# Patient Record
Sex: Female | Born: 1990 | Race: Black or African American | Hispanic: No | Marital: Single | Smoking: Never smoker
Health system: Southern US, Community
[De-identification: ages and names within clinical notes are randomized; demographics above are authoritative.]

## PROBLEM LIST (undated history)

## (undated) ENCOUNTER — Inpatient Hospital Stay (HOSPITAL_COMMUNITY): Payer: Self-pay

## (undated) DIAGNOSIS — R51 Headache: Secondary | ICD-10-CM

## (undated) DIAGNOSIS — Z789 Other specified health status: Secondary | ICD-10-CM

## (undated) DIAGNOSIS — O24419 Gestational diabetes mellitus in pregnancy, unspecified control: Secondary | ICD-10-CM

## (undated) DIAGNOSIS — E7800 Pure hypercholesterolemia, unspecified: Secondary | ICD-10-CM

## (undated) DIAGNOSIS — R519 Headache, unspecified: Secondary | ICD-10-CM

## (undated) DIAGNOSIS — T7840XA Allergy, unspecified, initial encounter: Secondary | ICD-10-CM

## (undated) HISTORY — DX: Allergy, unspecified, initial encounter: T78.40XA

---
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
## 2008-01-05 ENCOUNTER — Emergency Department (HOSPITAL_COMMUNITY): Admission: EM | Admit: 2008-01-05 | Discharge: 2008-01-05 | Payer: Self-pay | Admitting: Emergency Medicine

## 2008-01-15 ENCOUNTER — Ambulatory Visit: Payer: Self-pay | Admitting: Internal Medicine

## 2008-01-28 ENCOUNTER — Inpatient Hospital Stay (HOSPITAL_COMMUNITY): Admission: AD | Admit: 2008-01-28 | Discharge: 2008-01-29 | Payer: Self-pay | Admitting: Obstetrics and Gynecology

## 2008-02-01 ENCOUNTER — Inpatient Hospital Stay (HOSPITAL_COMMUNITY): Admission: AD | Admit: 2008-02-01 | Discharge: 2008-02-02 | Payer: Self-pay | Admitting: Obstetrics & Gynecology

## 2008-02-01 ENCOUNTER — Inpatient Hospital Stay (HOSPITAL_COMMUNITY): Admission: AD | Admit: 2008-02-01 | Discharge: 2008-02-01 | Payer: Self-pay | Admitting: Family Medicine

## 2008-02-04 ENCOUNTER — Inpatient Hospital Stay (HOSPITAL_COMMUNITY): Admission: AD | Admit: 2008-02-04 | Discharge: 2008-02-04 | Payer: Self-pay | Admitting: Obstetrics & Gynecology

## 2008-02-11 ENCOUNTER — Inpatient Hospital Stay (HOSPITAL_COMMUNITY): Admission: AD | Admit: 2008-02-11 | Discharge: 2008-02-11 | Payer: Self-pay | Admitting: Obstetrics and Gynecology

## 2008-03-10 ENCOUNTER — Emergency Department (HOSPITAL_COMMUNITY): Admission: EM | Admit: 2008-03-10 | Discharge: 2008-03-10 | Payer: Self-pay | Admitting: Emergency Medicine

## 2008-03-26 ENCOUNTER — Encounter (INDEPENDENT_AMBULATORY_CARE_PROVIDER_SITE_OTHER): Payer: Self-pay | Admitting: Internal Medicine

## 2008-03-26 ENCOUNTER — Ambulatory Visit: Payer: Self-pay | Admitting: Internal Medicine

## 2008-03-26 LAB — CONVERTED CEMR LAB: Chlamydia, DNA Probe: NEGATIVE

## 2008-05-22 ENCOUNTER — Ambulatory Visit: Payer: Self-pay | Admitting: Internal Medicine

## 2008-06-21 ENCOUNTER — Inpatient Hospital Stay (HOSPITAL_COMMUNITY): Admission: AD | Admit: 2008-06-21 | Discharge: 2008-06-21 | Payer: Self-pay | Admitting: Obstetrics & Gynecology

## 2008-07-31 ENCOUNTER — Inpatient Hospital Stay (HOSPITAL_COMMUNITY): Admission: AD | Admit: 2008-07-31 | Discharge: 2008-07-31 | Payer: Self-pay | Admitting: Obstetrics and Gynecology

## 2008-10-15 ENCOUNTER — Inpatient Hospital Stay (HOSPITAL_COMMUNITY): Admission: AD | Admit: 2008-10-15 | Discharge: 2008-10-15 | Payer: Self-pay | Admitting: Obstetrics and Gynecology

## 2008-11-22 ENCOUNTER — Inpatient Hospital Stay (HOSPITAL_COMMUNITY): Admission: AD | Admit: 2008-11-22 | Discharge: 2008-11-22 | Payer: Self-pay | Admitting: Obstetrics and Gynecology

## 2009-01-16 ENCOUNTER — Inpatient Hospital Stay (HOSPITAL_COMMUNITY): Admission: AD | Admit: 2009-01-16 | Discharge: 2009-01-20 | Payer: Self-pay | Admitting: Obstetrics and Gynecology

## 2009-01-17 ENCOUNTER — Encounter (INDEPENDENT_AMBULATORY_CARE_PROVIDER_SITE_OTHER): Payer: Self-pay | Admitting: Obstetrics and Gynecology

## 2010-06-19 ENCOUNTER — Emergency Department (HOSPITAL_COMMUNITY)
Admission: EM | Admit: 2010-06-19 | Discharge: 2010-06-20 | Disposition: A | Discharge status: Home / Self Care | Payer: Self-pay | Attending: Emergency Medicine | Admitting: Emergency Medicine

## 2010-06-19 DIAGNOSIS — R079 Chest pain, unspecified: Secondary | ICD-10-CM | POA: Insufficient documentation

## 2010-06-19 DIAGNOSIS — R42 Dizziness and giddiness: Secondary | ICD-10-CM | POA: Insufficient documentation

## 2010-06-19 DIAGNOSIS — M549 Dorsalgia, unspecified: Secondary | ICD-10-CM | POA: Insufficient documentation

## 2010-06-20 ENCOUNTER — Emergency Department (HOSPITAL_COMMUNITY): Payer: Self-pay

## 2010-07-09 LAB — CBC
HCT: 34.3 % — ABNORMAL LOW (ref 36.0–46.0)
Hemoglobin: 12.6 g/dL (ref 12.0–15.0)
MCHC: 33.2 g/dL (ref 30.0–36.0)
MCHC: 33.4 g/dL (ref 30.0–36.0)
MCV: 86.6 fL (ref 78.0–100.0)
MCV: 86.6 fL (ref 78.0–100.0)
MCV: 86.1 fL (ref 78.0–100.0)
Platelets: 119 10*3/uL — ABNORMAL LOW (ref 150–400)
RDW: 15.2 % (ref 11.5–15.5)
RDW: 15.3 % (ref 11.5–15.5)
WBC: 17.7 10*3/uL — ABNORMAL HIGH (ref 4.0–10.5)
WBC: 8.9 10*3/uL (ref 4.0–10.5)

## 2010-07-11 LAB — WET PREP, GENITAL

## 2010-07-12 LAB — WET PREP, GENITAL: Clue Cells Wet Prep HPF POC: NONE SEEN

## 2010-07-12 LAB — GC/CHLAMYDIA PROBE AMP, GENITAL: GC Probe Amp, Genital: NEGATIVE

## 2010-07-15 LAB — URINALYSIS, ROUTINE W REFLEX MICROSCOPIC
Bilirubin Urine: NEGATIVE
Glucose, UA: NEGATIVE mg/dL
Ketones, ur: 15 mg/dL — AB
Specific Gravity, Urine: 1.02 (ref 1.005–1.030)
Urobilinogen, UA: 1 mg/dL (ref 0.0–1.0)
pH: 6 (ref 5.0–8.0)

## 2010-07-16 LAB — URINALYSIS, ROUTINE W REFLEX MICROSCOPIC
Bilirubin Urine: NEGATIVE
Glucose, UA: NEGATIVE mg/dL
Hgb urine dipstick: NEGATIVE
Ketones, ur: NEGATIVE mg/dL
Nitrite: NEGATIVE
Protein, ur: NEGATIVE mg/dL
Specific Gravity, Urine: 1.025 (ref 1.005–1.030)
Urobilinogen, UA: 0.2 mg/dL (ref 0.0–1.0)
pH: 6 (ref 5.0–8.0)

## 2010-07-16 LAB — WET PREP, GENITAL
Trich, Wet Prep: NONE SEEN
Yeast Wet Prep HPF POC: NONE SEEN

## 2010-07-16 LAB — GC/CHLAMYDIA PROBE AMP, GENITAL
Chlamydia, DNA Probe: NEGATIVE
GC Probe Amp, Genital: NEGATIVE

## 2010-08-14 IMAGING — US US OB TRANSVAGINAL
1 series · 14 of 25 positions shown · non-contrast
Comparison: 01/28/2008

CLINICAL DATA: Right ectopic pregnancy.  Given methotrexate on
[DATE].  Now with heavy bleeding and pain.

TRANSVAGINAL OB ULTRASOUND
TECHNIQUE: Transvaginal ultrasound was performed for evaluation of
the gestation as well as the maternal uterus and adnexal regions.

[Series 1: us ob transvaginal · 0.14mm/px · 14 of 25 slices shown]
[im 1/25]
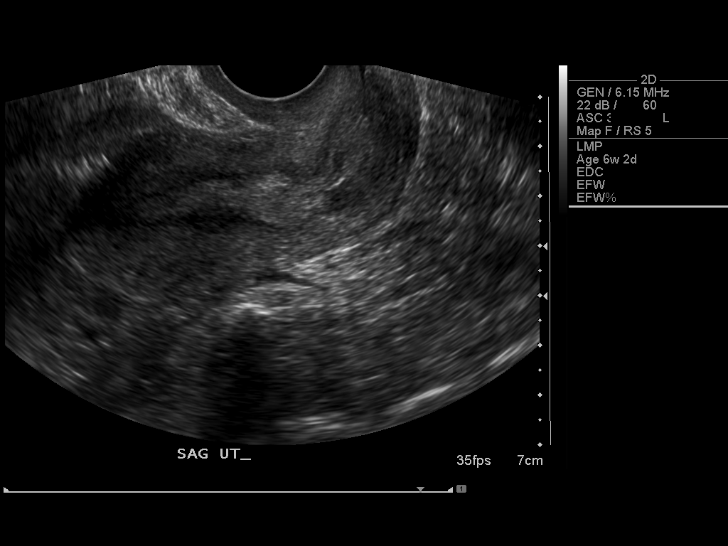
[im 3/25]
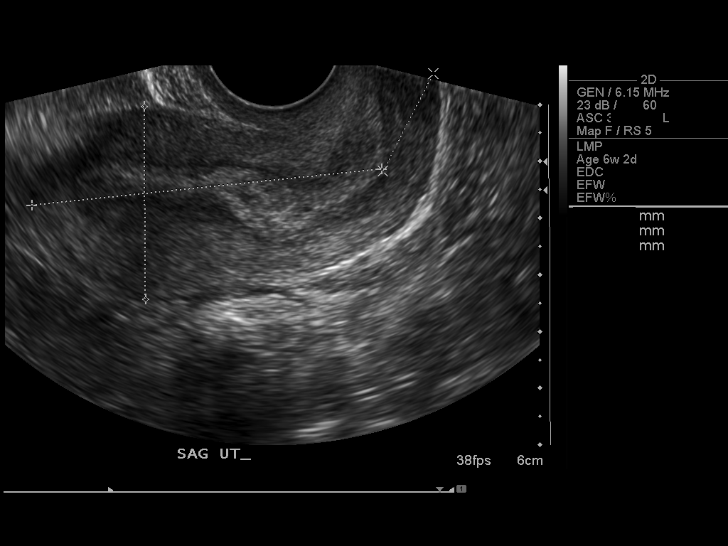
[im 5/25]
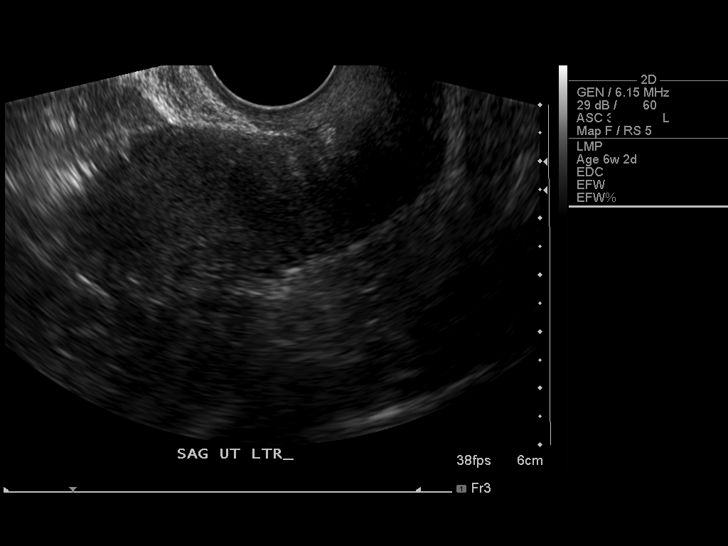
[im 7/25]
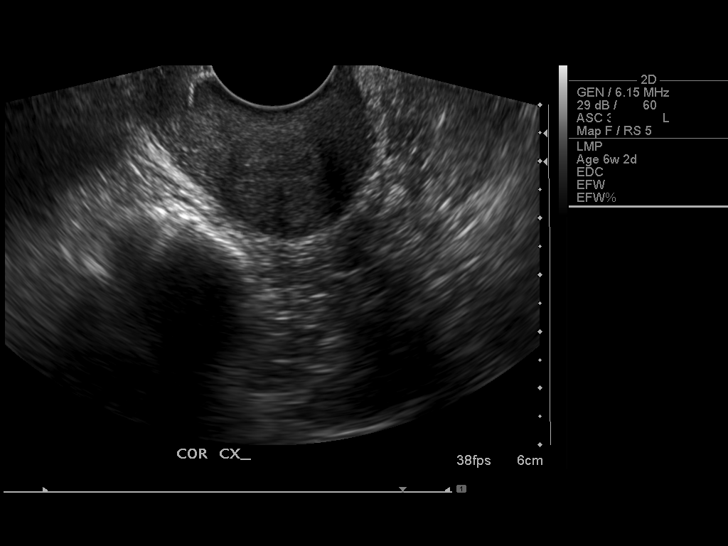
[im 9/25]
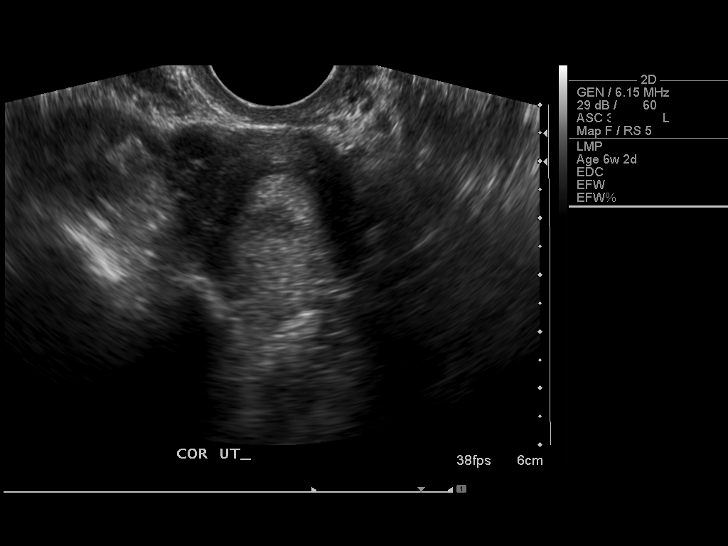
[im 10/25]
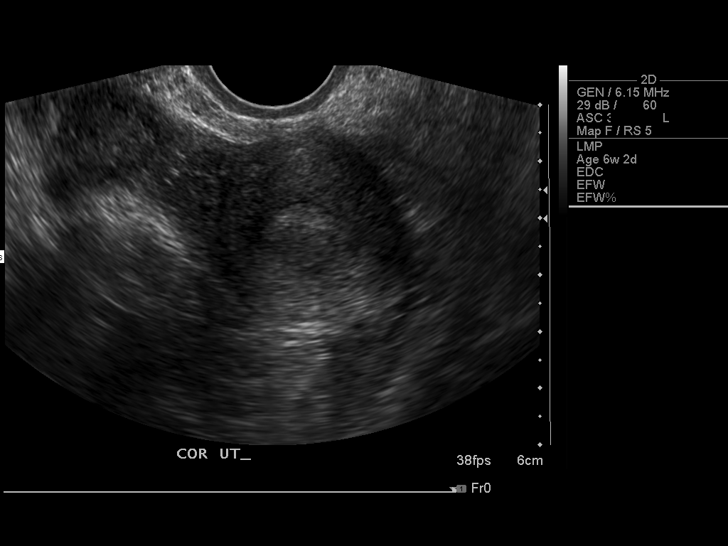
[im 12/25]
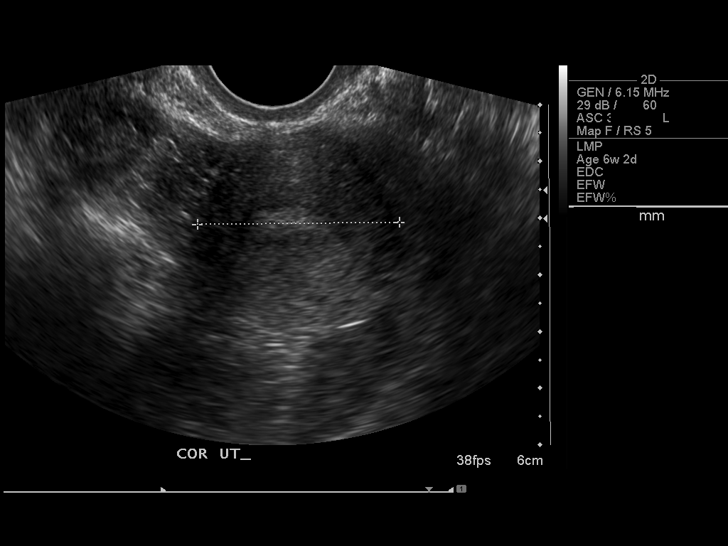
[im 14/25]
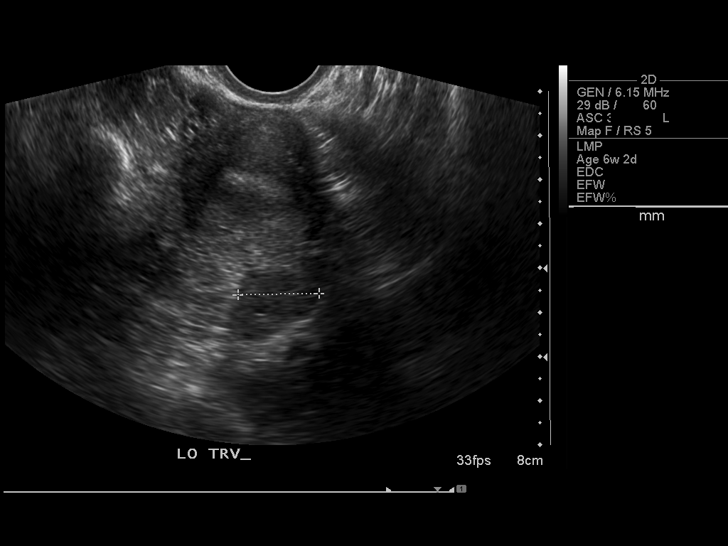
[im 16/25]
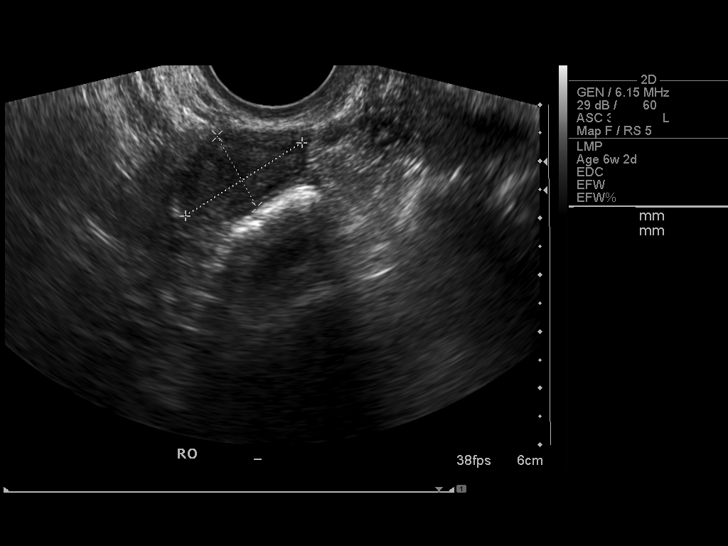
[im 17/25]
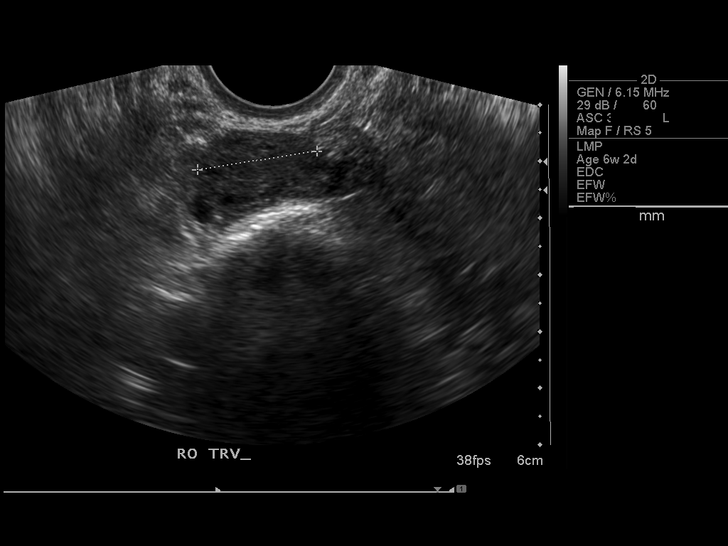
[im 19/25]
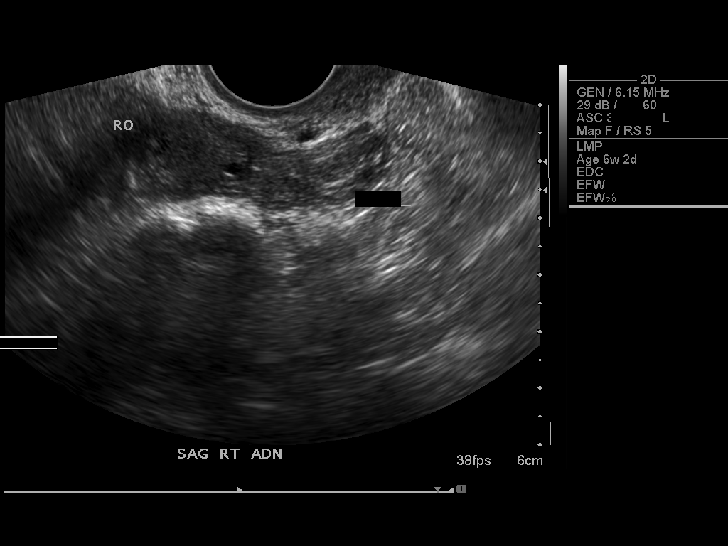
[im 21/25]
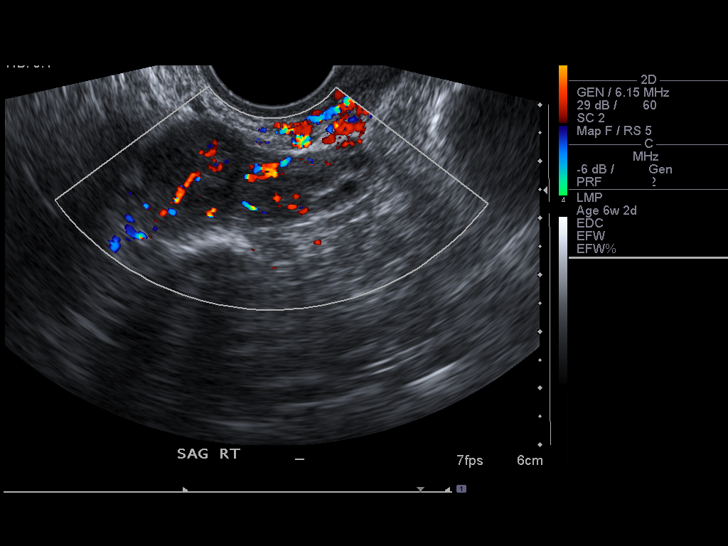
[im 23/25]
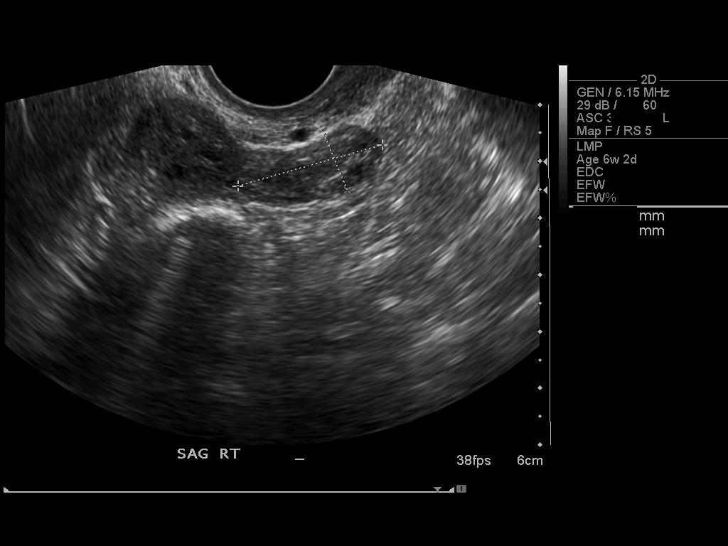
[im 25/25]
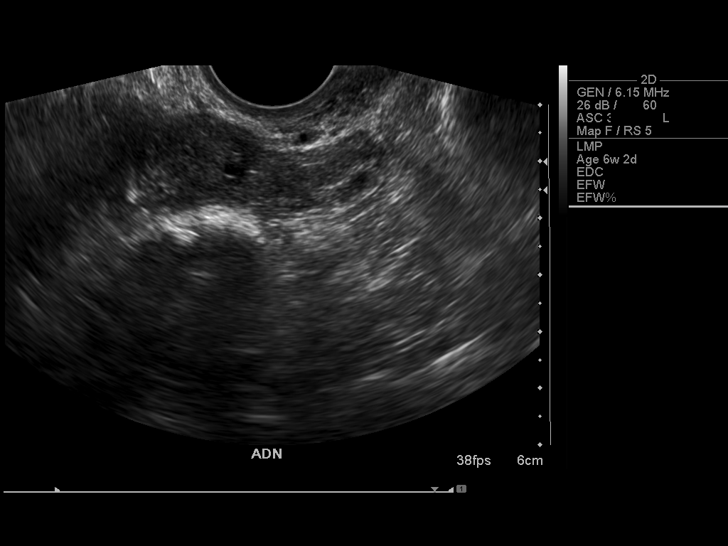

[14 of 25 positions shown; findings below may reference images not displayed]

FINDINGS: No intrauterine gestation is identified.  Within the
right adnexa, there is a soft tissue mass adjacent to the right
ovary, measuring 2.7 x 1.7 x 1.1 cm.  This is slightly smaller when
the mass measured 3.1 cm previously.  No free fluid.
IMPRESSION: 2.7 cm soft tissue mass adjacent to the right ovary in the right
adnexa.  Cannot exclude ectopic pregnancy.

No intrauterine gestation.

## 2010-12-31 LAB — URINE MICROSCOPIC-ADD ON

## 2010-12-31 LAB — GC/CHLAMYDIA PROBE AMP, GENITAL
Chlamydia, DNA Probe: NEGATIVE
GC Probe Amp, Genital: NEGATIVE

## 2010-12-31 LAB — URINALYSIS, ROUTINE W REFLEX MICROSCOPIC
Bilirubin Urine: NEGATIVE
Bilirubin Urine: NEGATIVE
Glucose, UA: NEGATIVE
Protein, ur: NEGATIVE
Urobilinogen, UA: 0.2
pH: 5

## 2010-12-31 LAB — CBC
HCT: 37.8
Platelets: 312
RBC: 4.79
WBC: 8.4

## 2010-12-31 LAB — WET PREP, GENITAL
Clue Cells Wet Prep HPF POC: NONE SEEN
Trich, Wet Prep: NONE SEEN
Yeast Wet Prep HPF POC: NONE SEEN

## 2010-12-31 LAB — HCG, QUANTITATIVE, PREGNANCY: hCG, Beta Chain, Quant, S: <2

## 2011-01-04 LAB — GC/CHLAMYDIA PROBE AMP, GENITAL
Chlamydia, DNA Probe: NEGATIVE
GC Probe Amp, Genital: NEGATIVE

## 2011-01-04 LAB — URINALYSIS, ROUTINE W REFLEX MICROSCOPIC
Nitrite: NEGATIVE
Specific Gravity, Urine: >1.03 — ABNORMAL HIGH
Urobilinogen, UA: 0.2
pH: 6

## 2011-01-04 LAB — URINE MICROSCOPIC-ADD ON

## 2011-01-04 LAB — DIFFERENTIAL
Lymphocytes Relative: 28
Lymphs Abs: 1.8
Monocytes Relative: 7
Neutro Abs: 4.1
Neutrophils Relative %: 62

## 2011-01-04 LAB — CBC
RBC: 4.64
WBC: 6.5

## 2011-01-04 LAB — WET PREP, GENITAL: Trich, Wet Prep: NONE SEEN

## 2011-01-04 LAB — BUN: BUN: 8

## 2011-01-04 LAB — CREATININE, SERUM: GFR calc Af Amer: >60

## 2011-01-04 LAB — AST: AST: 15

## 2011-01-05 LAB — HCG, QUANTITATIVE, PREGNANCY: hCG, Beta Chain, Quant, S: <2

## 2011-01-08 LAB — POCT I-STAT, CHEM 8
Chloride: 104 mEq/L (ref 96–112)
Creatinine, Ser: 1 mg/dL (ref 0.4–1.2)
HCT: 41 % (ref 36.0–46.0)
Hemoglobin: 13.9 g/dL (ref 12.0–15.0)
Potassium: 3.9 mEq/L (ref 3.5–5.1)
Sodium: 140 mEq/L (ref 135–145)

## 2011-01-08 LAB — URINALYSIS, ROUTINE W REFLEX MICROSCOPIC
Bilirubin Urine: NEGATIVE
Ketones, ur: NEGATIVE mg/dL
Leukocytes, UA: NEGATIVE
Nitrite: NEGATIVE
Protein, ur: NEGATIVE mg/dL
Urobilinogen, UA: 0.2 mg/dL (ref 0.0–1.0)

## 2011-01-08 LAB — DIFFERENTIAL
Eosinophils Absolute: 0.2 10*3/uL (ref 0.0–0.7)
Lymphs Abs: 1.6 10*3/uL (ref 0.7–4.0)
Neutro Abs: 3.9 10*3/uL (ref 1.7–7.7)
Neutrophils Relative %: 64 % (ref 43–77)

## 2011-01-08 LAB — CBC
MCV: 82.4 fL (ref 78.0–100.0)
Platelets: 276 10*3/uL (ref 150–400)
RBC: 4.87 MIL/uL (ref 3.87–5.11)
WBC: 6.2 10*3/uL (ref 4.0–10.5)

## 2011-01-08 LAB — WET PREP, GENITAL
Clue Cells Wet Prep HPF POC: NONE SEEN
Trich, Wet Prep: NONE SEEN
Yeast Wet Prep HPF POC: NONE SEEN

## 2011-01-08 LAB — PREGNANCY, URINE: Preg Test, Ur: NEGATIVE

## 2011-01-08 LAB — URINE MICROSCOPIC-ADD ON

## 2011-01-17 ENCOUNTER — Emergency Department (HOSPITAL_COMMUNITY)
Admission: EM | Admit: 2011-01-17 | Discharge: 2011-01-18 | Disposition: A | Discharge status: Home / Self Care | Payer: Self-pay | Attending: Emergency Medicine | Admitting: Emergency Medicine

## 2011-01-17 DIAGNOSIS — Y92009 Unspecified place in unspecified non-institutional (private) residence as the place of occurrence of the external cause: Secondary | ICD-10-CM | POA: Insufficient documentation

## 2011-01-17 DIAGNOSIS — S6000XA Contusion of unspecified finger without damage to nail, initial encounter: Secondary | ICD-10-CM | POA: Insufficient documentation

## 2011-01-17 DIAGNOSIS — D573 Sickle-cell trait: Secondary | ICD-10-CM | POA: Insufficient documentation

## 2011-01-17 DIAGNOSIS — X58XXXA Exposure to other specified factors, initial encounter: Secondary | ICD-10-CM | POA: Insufficient documentation

## 2011-06-09 ENCOUNTER — Encounter (HOSPITAL_COMMUNITY): Payer: Self-pay | Admitting: *Deleted

## 2011-06-09 ENCOUNTER — Inpatient Hospital Stay (HOSPITAL_COMMUNITY)
Admission: AD | Admit: 2011-06-09 | Discharge: 2011-06-09 | Disposition: A | Discharge status: Home / Self Care | Payer: 59 | Source: Ambulatory Visit | Attending: Obstetrics and Gynecology | Admitting: Obstetrics and Gynecology

## 2011-06-09 DIAGNOSIS — B009 Herpesviral infection, unspecified: Secondary | ICD-10-CM

## 2011-06-09 DIAGNOSIS — A6000 Herpesviral infection of urogenital system, unspecified: Secondary | ICD-10-CM | POA: Insufficient documentation

## 2011-06-09 DIAGNOSIS — R300 Dysuria: Secondary | ICD-10-CM | POA: Insufficient documentation

## 2011-06-09 DIAGNOSIS — N390 Urinary tract infection, site not specified: Secondary | ICD-10-CM

## 2011-06-09 HISTORY — DX: Pure hypercholesterolemia, unspecified: E78.00

## 2011-06-09 LAB — URINALYSIS, ROUTINE W REFLEX MICROSCOPIC
Glucose, UA: 100 mg/dL — AB
Protein, ur: 30 mg/dL — AB
Specific Gravity, Urine: 1.02 (ref 1.005–1.030)
Urobilinogen, UA: 1 mg/dL (ref 0.0–1.0)

## 2011-06-09 LAB — URINE MICROSCOPIC-ADD ON

## 2011-06-09 MED ORDER — SULFAMETHOXAZOLE-TRIMETHOPRIM 800-160 MG PO TABS: 1.0000 | ORAL_TABLET | Freq: Two times a day (BID) | ORAL | Status: AC

## 2011-06-09 MED ORDER — VALACYCLOVIR HCL 1 G PO TABS: 1000.0000 mg | ORAL_TABLET | Freq: Two times a day (BID) | ORAL | Status: AC

## 2011-06-09 MED ORDER — LIDOCAINE HCL 2 % EX GEL
Freq: Once | CUTANEOUS | Status: AC
  Administered 2011-06-09: 10 via TOPICAL
  Filled 2011-06-09: qty 20

## 2011-06-09 MED ORDER — LIDOCAINE HCL 2 % EX GEL: Freq: Once | CUTANEOUS | Status: AC

## 2011-06-09 NOTE — Progress Notes (Signed)
As at office on Mon, dx with urinary tract infection, bladder infection and vaginitis.  Was given meds and told to take AZO.  Symptoms getting worse, hurts too much to sit down. Everything down there hurts.  Noted blood in urine this morning. Was tested - cultures were neg.  Frequency and pain with urination.

## 2011-06-09 NOTE — Discharge Instructions (Signed)
Get your prescriptions filled today and take all as directed.  Prescriptions were sent electronically to your pharmacy. Follow up in the office for any additional problems.

## 2011-06-09 NOTE — ED Provider Notes (Signed)
History     Chief Complaint  Patient presents with  . Dysuria   HPI Cathy Woodard 20 y.o. was seen in the office on Monday.  Diagnosed with UTI, vaginitis and given Tindamax, Nystatin cream, and azo tablets.  Today pain is worse.  Having dysuria and cannot sit down.  Does not want genital exam due to pain.   OB History    Grav Para Term Preterm Abortions TAB SAB Ect Mult Living   3 1 1  0 2 0 1 1 0 1      Past Medical History  Diagnosis Date  . Hypercholesteremia     Past Surgical History  Procedure Date  . Cesarean section     Family History  Problem Relation Age of Onset  . Diabetes Maternal Grandmother     History  Substance Use Topics  . Smoking status: Never Smoker   . Smokeless tobacco: Never Used  . Alcohol Use: Yes     Occas    Allergies: No Known Allergies  Prescriptions prior to admission  Medication Sig Dispense Refill  . AZO-CRANBERRY PO Take 1 tablet by mouth 2 (two) times daily.      Marland Kitchen nystatin-triamcinolone ointment (MYCOLOG) Apply 1 application topically 3 (three) times daily as needed. For irritation      . Phenazopyridine HCl (AZO TABS PO) Take 2 tablets by mouth 3 (three) times daily.      Marland Kitchen tinidazole (TINDAMAX) 500 MG tablet Take 1,000 mg by mouth 2 (two) times daily. For 2 days        Review of Systems  Genitourinary: Positive for dysuria.       Blood in urine Vulvar pain   Physical Exam   Blood pressure 118/87, pulse 94, temperature 98.6 F (37 C), temperature source Oral, resp. rate 18, last menstrual period 05/24/2011.  Physical Exam  Nursing note and vitals reviewed. Constitutional: She is oriented to person, place, and time. She appears well-developed and well-nourished.  HENT:  Head: Normocephalic.  Eyes: EOM are normal.  Neck: Neck supple.  GI: Soft. There is no tenderness.  Genitourinary:       Visualization of vulva.  One blister like area on left labia majora, one ulceration on right labia majora, tissue around  clitoris is swollen with red areas noted.  Client not able to tolerate any further exam. Tearful. HSV culture done of ulcerated area.   Musculoskeletal: Normal range of motion.  Neurological: She is alert and oriented to person, place, and time.  Skin: Skin is warm and dry.  Psychiatric: She has a normal mood and affect.    MAU Course  Procedures Results for orders placed during the hospital encounter of 06/09/11 (from the past 24 hour(s))  POCT PREGNANCY, URINE     Status: Normal   Collection Time   06/09/11  9:26 AM      Component Value Range   Preg Test, Ur NEGATIVE  NEGATIVE   URINALYSIS, ROUTINE W REFLEX MICROSCOPIC     Status: Abnormal   Collection Time   06/09/11  9:37 AM      Component Value Range   Color, Urine ORANGE (*) YELLOW    APPearance CLEAR  CLEAR    Specific Gravity, Urine 1.020  1.005 - 1.030    pH 5.5  5.0 - 8.0    Glucose, UA 100 (*) NEGATIVE (mg/dL)   Hgb urine dipstick TRACE (*) NEGATIVE    Bilirubin Urine NEGATIVE  NEGATIVE    Ketones, ur NEGATIVE  NEGATIVE (mg/dL)   Protein, ur 30 (*) NEGATIVE (mg/dL)   Urobilinogen, UA 1.0  0.0 - 1.0 (mg/dL)   Nitrite POSITIVE (*) NEGATIVE    Leukocytes, UA MODERATE (*) NEGATIVE   URINE MICROSCOPIC-ADD ON     Status: Normal   Collection Time   06/09/11  9:37 AM      Component Value Range   Squamous Epithelial / LPF RARE  RARE    WBC, UA 11-20  <3 (WBC/hpf)   Bacteria, UA RARE  RARE    MDM Consult with Dr. 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  Client having emotional distress over diagnosis.  Crying loudly for over 30 minutes.    Assessment and Plan  HSV UTI  Plan: Rx valtrex 1 gm po bid x 5 days Rx Septra DS one po bid x 3 days Lidocaine jelly here in MAU and prescribed. Prescriptions sent to pharmacy electronically HSV culture pending. Client calmer now and will discharge. No sex until healed. Follow up in the office for any further pain.  Sricharan Lacomb 06/09/2011, 10:11 AM   Nolene Bernheim, NP 06/09/11 1101  Nolene Bernheim, NP 06/09/11 1109

## 2011-06-11 ENCOUNTER — Inpatient Hospital Stay (HOSPITAL_COMMUNITY)
Admission: AD | Admit: 2011-06-11 | Discharge: 2011-06-11 | Disposition: A | Discharge status: Home / Self Care | Payer: 59 | Attending: Obstetrics and Gynecology | Admitting: Obstetrics and Gynecology

## 2011-06-11 ENCOUNTER — Encounter (HOSPITAL_COMMUNITY): Payer: Self-pay | Admitting: *Deleted

## 2011-06-11 DIAGNOSIS — M533 Sacrococcygeal disorders, not elsewhere classified: Secondary | ICD-10-CM | POA: Insufficient documentation

## 2011-06-11 DIAGNOSIS — S300XXA Contusion of lower back and pelvis, initial encounter: Secondary | ICD-10-CM

## 2011-06-11 NOTE — Discharge Instructions (Signed)
Tailbone Injury The tailbone (coccyx) is the small bone at the lower end of the spine. A tailbone injury may involve stretched ligaments, bruising, or a broken bone (fracture). Women are more vulnerable to this injury due to having a wider pelvis. CAUSES  This type of injury typically occurs from falling and landing on the tailbone. Repeated strain or friction from actions such as rowing and bicycling may also injure the area. The tailbone can be injured during childbirth. Infections or tumors may also press on the tailbone and cause pain. Sometimes, the cause of injury is unknown. SYMPTOMS   Bruising.   Pain when sitting.   Painful bowel movements.   In women, pain during intercourse.  DIAGNOSIS  Your caregiver can diagnose a tailbone injury based on your symptoms and a physical exam. X-rays may be taken if a fracture is suspected. Your caregiver may also use an MRI scan imaging test to evaluate your symptoms. TREATMENT  Your caregiver may prescribe medicines to help relieve your pain. Most tailbone injuries heal on their own in 4 to 6 weeks. However, if the injury is caused by an infection or tumor, the recovery period may vary. PREVENTION  Wear appropriate padding and sports gear when bicycling and rowing. This can help prevent an injury from repeated strain or friction. HOME CARE INSTRUCTIONS   Put ice on the injured area.   Put ice in a plastic bag.   Place a towel between your skin and the bag.   Leave the ice on for 15 to 20 minutes, every hour while awake for the first 1 to 2 days.   Sit on a large, rubber or inflated ring or cushion to ease your pain. Lean forward when sitting to help decrease discomfort.   Avoid sitting for long periods of time.   Increase your activity as the pain allows.   Only take over-the-counter or prescription medicines for pain, discomfort, or fever as directed by your caregiver.   You may use stool softeners if it is painful to have a bowel  movement, or as directed by your caregiver.   Eat a diet with plenty of fiber to help prevent constipation.   Keep all follow-up appointments as directed by your caregiver.  SEEK MEDICAL CARE IF:   Your pain becomes worse.   Your bowel movements cause a great deal of discomfort.   You are unable to have a bowel movement.   You have a fever.  MAKE SURE YOU:  Understand these instructions.   Will watch your condition.   Will get help right away if you are not doing well or get worse.  Document Released: 03/19/2000 Document Revised: 03/11/2011 Document Reviewed: 10/15/2010 [employer] Patient Information 2012 [employer]. 

## 2011-06-11 NOTE — Progress Notes (Signed)
PT SAYS SHE WENT TO DR ON Monday- FOR VAG IRRITATION- GAVE MED  - TOOK- MED - GOT WORSE SO  CAME  TO  MAU ON WED- TOLD  HAD UTI-   TOOK CX- UNSURE IF HSV - WAITING FOR RESULTS.  SAYS TAILBONE STARTED HURTING  EARLIER- SHE THOUGHT WOULD GO AWAY- BUT KNOW WORSE.  SHE FELT PAIN IN LOWER   GROIN AREA

## 2011-06-11 NOTE — Progress Notes (Signed)
Pt reports pain in tailbone for the last couple of hours. Pt reports started on R side of lower back, but pt now states it is toward the middle.

## 2011-06-11 NOTE — ED Provider Notes (Signed)
History   Pt presents today c/o "tail bone" pain. She states she sits all day at work and someone told her the pain could be coming from the UTI she was recently diagnosed with. She denies worsening urinary sx, vag sx, fever, or any other sx. She is taking all medications as directed.  Chief Complaint  Patient presents with  . Tailbone Pain   HPI  OB History    Grav Para Term Preterm Abortions TAB SAB Ect Mult Living   3 1 1  0 2 0 1 1 0 1      Past Medical History  Diagnosis Date  . Hypercholesteremia     Past Surgical History  Procedure Date  . Cesarean section     Family History  Problem Relation Age of Onset  . Diabetes Maternal Grandmother   . Anesthesia problems Neg Hx   . Hypotension Neg Hx   . Malignant hyperthermia Neg Hx   . Pseudochol deficiency Neg Hx     History  Substance Use Topics  . Smoking status: Never Smoker   . Smokeless tobacco: Never Used  . Alcohol Use: Yes     Occas    Allergies: No Known Allergies  Prescriptions prior to admission  Medication Sig Dispense Refill  . lidocaine (XYLOCAINE) 2 % jelly Apply topically once.  30 mL  1  . sulfamethoxazole-trimethoprim (BACTRIM DS) 800-160 MG per tablet Take 1 tablet by mouth 2 (two) times daily.  6 tablet  0  . tinidazole (TINDAMAX) 500 MG tablet Take 1,000 mg by mouth 2 (two) times daily. For 2 days      . valACYclovir (VALTREX) 1000 MG tablet Take 1 tablet (1,000 mg total) by mouth 2 (two) times daily.  10 tablet  0    Review of Systems  Constitutional: Negative for fever and chills.  Eyes: Negative for blurred vision and double vision.  Respiratory: Negative for cough, hemoptysis, sputum production, shortness of breath and wheezing.   Cardiovascular: Negative for chest pain and palpitations.  Gastrointestinal: Negative for nausea, vomiting, abdominal pain, diarrhea and constipation.  Genitourinary: Negative for dysuria, urgency, frequency and hematuria.  Musculoskeletal: Negative for  falls.  Neurological: Negative for dizziness and headaches.  Psychiatric/Behavioral: Negative for depression and suicidal ideas.   Physical Exam   Blood pressure 102/68, pulse 90, temperature 98.5 F (36.9 C), temperature source Oral, resp. rate 18, height 5\' 5"  (1.651 m), weight 187 lb 6 oz (84.993 kg), last menstrual period 05/24/2011.  Physical Exam  Nursing note and vitals reviewed. Constitutional: She is oriented to person, place, and time. She appears well-developed and well-nourished. No distress.  HENT:  Head: Normocephalic and atraumatic.  Eyes: EOM are normal. Pupils are equal, round, and reactive to light.  GI: Soft. She exhibits no distension. There is no tenderness. There is no rebound and no guarding.  Musculoskeletal:       Pt with tenderness over coccyx to palpation. No deformity noted. No skin lesions noted. No evidence of pilonidal cyst.  Neurological: She is alert and oriented to person, place, and time.  Skin: Skin is warm and dry. She is not diaphoretic.  Psychiatric: She has a normal mood and affect. Her behavior is normal. Judgment and thought content normal.    MAU Course  Procedures    Assessment and Plan  Bruised coccyx: discussed with pt at length. She will f/u with her PCP. Advised to avoid unnecessary pressure. Pt was given a donut cushion. Discussed with pt at length. Discussed  diet, activity, risks, and precautions.  Clinton Gallant. Thorin Starner III, DrHSc, MPAS, PA-C  06/11/2011, 2:00 AM   Henrietta Hoover, PA 06/11/11 [phone]

## 2011-11-27 ENCOUNTER — Inpatient Hospital Stay (HOSPITAL_COMMUNITY)
Admission: AD | Admit: 2011-11-27 | Discharge: 2011-11-28 | Disposition: A | Discharge status: Home / Self Care | Payer: 59 | Source: Ambulatory Visit | Attending: Family Medicine | Admitting: Family Medicine

## 2011-11-27 DIAGNOSIS — L259 Unspecified contact dermatitis, unspecified cause: Secondary | ICD-10-CM | POA: Insufficient documentation

## 2011-11-27 DIAGNOSIS — L239 Allergic contact dermatitis, unspecified cause: Secondary | ICD-10-CM

## 2011-11-27 HISTORY — DX: Other specified health status: Z78.9

## 2011-11-27 LAB — POCT PREGNANCY, URINE: Preg Test, Ur: NEGATIVE

## 2011-11-27 NOTE — MAU Note (Signed)
Pt states, " I noticed having itching and red bumps on my right breast at 7 pm tonight." Pt reports changing to cetaphil soap, no detergent change.

## 2011-11-28 ENCOUNTER — Encounter (HOSPITAL_COMMUNITY): Payer: Self-pay | Admitting: *Deleted

## 2011-11-28 DIAGNOSIS — L259 Unspecified contact dermatitis, unspecified cause: Secondary | ICD-10-CM

## 2011-11-28 MED ORDER — TRIAMCINOLONE ACETONIDE 0.1 % EX CREA
1.0000 "application " | TOPICAL_CREAM | Freq: Two times a day (BID) | CUTANEOUS | Status: DC
  Administered 2011-11-28: 1 via TOPICAL
  Filled 2011-11-28: qty 15

## 2011-11-28 MED ORDER — DIPHENHYDRAMINE HCL 25 MG PO CAPS
25.0000 mg | ORAL_CAPSULE | Freq: Four times a day (QID) | ORAL | Status: DC | PRN
  Administered 2011-11-28: 25 mg via ORAL
  Filled 2011-11-28: qty 1

## 2011-11-28 MED ORDER — TRIAMCINOLONE ACETONIDE 0.1 % EX CREA: 1.0000 "application " | TOPICAL_CREAM | Freq: Two times a day (BID) | CUTANEOUS | Status: DC

## 2011-11-28 NOTE — MAU Provider Note (Signed)
  History     CSN: [id number]  Arrival date and time: 11/27/11 2304   None     Chief Complaint  Patient presents with  . Rash   HPI Cathy Woodard is a 20yo who presents for eval of sudden onset rash on right breast this evening. No difficulty with breathing, no throat swelling, hives, or any other symptoms. She reports starting to use Cetaphil soap over her entire body yesterday (hx exzema) but no new detergents or creams.  OB History    Grav Para Term Preterm Abortions TAB SAB Ect Mult Living   3 1 1  0 2 0 1 1 0 1      Past Medical History  Diagnosis Date  . Hypercholesteremia   . No pertinent past medical history     Past Surgical History  Procedure Date  . Cesarean section     Family History  Problem Relation Age of Onset  . Diabetes Maternal Grandmother   . Anesthesia problems Neg Hx   . Hypotension Neg Hx   . Malignant hyperthermia Neg Hx   . Pseudochol deficiency Neg Hx     History  Substance Use Topics  . Smoking status: Never Smoker   . Smokeless tobacco: Never Used  . Alcohol Use: Yes     Occas    Allergies: No Known Allergies  Prescriptions prior to admission  Medication Sig Dispense Refill  . lidocaine (XYLOCAINE) 2 % jelly Apply topically once.  30 mL  1  . valACYclovir (VALTREX) 1000 MG tablet Take 1 tablet (1,000 mg total) by mouth 2 (two) times daily.  10 tablet  0  . DISCONTD: AZO-CRANBERRY PO Take 1 tablet by mouth 3 (three) times daily. For urinary pain      . DISCONTD: nystatin-triamcinolone ointment (MYCOLOG) Apply 1 application topically 2 (two) times daily.        ROS Physical Exam   Blood pressure 113/74, pulse 74, temperature 98.6 F (37 C), temperature source Oral, resp. rate 16, height 5\' 4"  (1.626 m), weight 77.678 kg (171 lb 4 oz), last menstrual period 11/16/2011.  Physical Exam  Constitutional: She is oriented to person, place, and time. She appears well-developed and well-nourished.  HENT:  Head: Normocephalic.    Cardiovascular: Normal rate.   Respiratory: Effort normal.       Right breast with multiple raised erythematous patches over anterior aspect; no exudate; seems to follow along bra/shirt line but does not continue along shoulder and is not present on left breast  Musculoskeletal: Normal range of motion.  Neurological: She is alert and oriented to person, place, and time.  Skin: Skin is warm and dry.  Psychiatric: She has a normal mood and affect. Her behavior is normal. Thought content normal.   UPT negative  MAU Course  Procedures    Assessment and Plan  Allergic contact dermatitis  Given Benadryl 25mg  PO and tube & rx Kenalog 0.1% cream to apply BID x 2 weeks F/U with primary doctor if symptoms don't improve  SHAW, KIMBERLY 11/28/2011, 12:45 AM

## 2011-11-28 NOTE — MAU Provider Note (Signed)
Chart reviewed and agree with management and plan.  

## 2012-07-19 ENCOUNTER — Ambulatory Visit (INDEPENDENT_AMBULATORY_CARE_PROVIDER_SITE_OTHER): Payer: 59 | Admitting: Physician Assistant

## 2012-07-19 VITALS — BP 111/72 | HR 75 | Temp 98.8°F | Resp 16 | Ht 64.5 in | Wt 174.0 lb

## 2012-07-19 DIAGNOSIS — J309 Allergic rhinitis, unspecified: Secondary | ICD-10-CM

## 2012-07-19 DIAGNOSIS — R51 Headache: Secondary | ICD-10-CM

## 2012-07-19 MED ORDER — IBUPROFEN 600 MG PO TABS: 600.0000 mg | ORAL_TABLET | Freq: Three times a day (TID) | ORAL | Status: DC | PRN

## 2012-07-19 MED ORDER — FLUTICASONE PROPIONATE 50 MCG/ACT NA SUSP: 2.0000 | Freq: Every day | NASAL | Status: DC

## 2012-07-19 MED ORDER — CETIRIZINE HCL 10 MG PO TABS: 10.0000 mg | ORAL_TABLET | Freq: Every day | ORAL | Status: DC

## 2012-07-19 MED ORDER — IBUPROFEN 200 MG PO TABS: 600.0000 mg | ORAL_TABLET | Freq: Once | ORAL | Status: DC

## 2012-07-19 NOTE — Patient Instructions (Addendum)
Begin using the cetirizine (Zyrtec) once daily.  Also begin using the fluticasone (Flonase) nasal spray - both of these will help control your allergy symptoms.  If your symptoms are worsening or not improving, please let us know.  If you are acutely worsening (i.e. Having trouble breathing, etc) proceed to the emergency department.  Allergic Rhinitis Allergic rhinitis is when the mucous membranes in the nose respond to allergens. Allergens are particles in the air that cause your body to have an allergic reaction. This causes you to release allergic antibodies. Through a chain of events, these eventually cause you to release histamine into the blood stream (hence the use of antihistamines). Although meant to be protective to the body, it is this release that causes your discomfort, such as frequent sneezing, congestion and an itchy runny nose.  CAUSES  The pollen allergens may come from grasses, trees, and weeds. This is seasonal allergic rhinitis, or "hay fever." Other allergens cause year-round allergic rhinitis (perennial allergic rhinitis) such as house dust mite allergen, pet dander and mold spores.  SYMPTOMS   Nasal stuffiness (congestion).  Runny, itchy nose with sneezing and tearing of the eyes.  There is often an itching of the mouth, eyes and ears. It cannot be cured, but it can be controlled with medications. DIAGNOSIS  If you are unable to determine the offending allergen, skin or blood testing may find it. TREATMENT   Avoid the allergen.  Medications and allergy shots (immunotherapy) can help.  Hay fever may often be treated with antihistamines in pill or nasal spray forms. Antihistamines block the effects of histamine. There are over-the-counter medicines that may help with nasal congestion and swelling around the eyes. Check with your caregiver before taking or giving this medicine. If the treatment above does not work, there are many new medications your caregiver can  prescribe. Stronger medications may be used if initial measures are ineffective. Desensitizing injections can be used if medications and avoidance fails. Desensitization is when a patient is given ongoing shots until the body becomes less sensitive to the allergen. Make sure you follow up with your caregiver if problems continue. SEEK MEDICAL CARE IF:   You develop fever (more than 100.5 F (38.1 C).  You develop a cough that does not stop easily (persistent).  You have shortness of breath.  You start wheezing.  Symptoms interfere with normal daily activities. Document Released: 12/15/2000 Document Revised: 06/14/2011 Document Reviewed: 06/26/2008 [facility] Patient Information 2013 [place], [place].

## 2012-07-19 NOTE — Progress Notes (Signed)
Subjective:    Patient ID: Cathy Woodard, female    DOB: Jun 25, 1990, 22 y.o.   MRN: [id number]  HPI   Cathy Woodard is a very pleasant 22 yr old female here with concern for allergies.  States that she has had runny nose and sneezing for several days now.  Today she has begun having itchy, watery eyes and her eyelids are swollen.  She denies swelling of lips or tongue.  She denies difficulty breathing.  No coughing, SOB, or wheezing.  Denies any new foods or medication.  Thinks that she is allergic to pollen.  States each year her allergies have been getting worse.  She has never had eye involvement though which prompted her to come in.  Thinks the she may have rubbed her eyes after touching her car which was covered in pollen.  She has tried Claritin in the past, but states this doesn't work for her.  She has an allergy to Benadryl, it gives her a rash.  Has never tried any other allergy medication.  In addition pt states she as a headache and sensitivity to light.  States she has had migraines in the past but it's been awhile.  Usually advil will take of these but she has not tried that yet.  Unable to localize her headache.  Denies nausea, vomiting, sensitivity to sound.  States the HA developed throughout the day today.  Thinks perhaps it could be related to the allergies.      Review of Systems  Constitutional: Negative for fever, chills and fatigue.  HENT: Positive for congestion, facial swelling (eyelids), rhinorrhea and sneezing. Negative for ear pain and sore throat.   Eyes: Positive for photophobia and itching. Negative for pain, discharge, redness and visual disturbance.  Respiratory: Negative for cough and wheezing.   Cardiovascular: Negative.   Gastrointestinal: Negative.   Musculoskeletal: Negative.   Skin: Negative.   Allergic/Immunologic: Positive for environmental allergies.  Neurological: Positive for headaches.       Objective:   Physical Exam  Vitals  reviewed. Constitutional: She is oriented to person, place, and time. She appears well-developed and well-nourished. No distress.  HENT:  Head: Normocephalic and atraumatic.  Right Ear: Tympanic membrane and ear canal normal.  Left Ear: Tympanic membrane and ear canal normal.  Nose: Mucosal edema (pale turbinates) and rhinorrhea present.  Mouth/Throat: Uvula is midline, oropharynx is clear and moist and mucous membranes are normal.  No facial swelling  Eyes: Conjunctivae are normal. Pupils are equal, round, and reactive to light. Right eye exhibits no chemosis and no discharge. Left eye exhibits no chemosis and no discharge. Right conjunctiva is not injected. Left conjunctiva is not injected. No scleral icterus.  Bilateral lids swollen but not erythematous or warm; allergic shiners present bilaterally  Neck: Neck supple.  Cardiovascular: Normal rate, regular rhythm and normal heart sounds.  Exam reveals no gallop and no friction rub.   No murmur heard. Pulmonary/Chest: Effort normal and breath sounds normal. She has no wheezes. She has no rales.  Lymphadenopathy:    She has no cervical adenopathy.  Neurological: She is alert and oriented to person, place, and time.  Skin: Skin is warm and dry.  Psychiatric: She has a normal mood and affect. Her behavior is normal.     Filed Vitals:   07/19/12 1440  BP: 111/72  Pulse: 75  Temp: 98.8 F (37.1 C)  Resp: 16        Assessment & Plan:  Allergic rhinitis - Plan:  cetirizine (ZYRTEC) 10 MG tablet, fluticasone (FLONASE) 50 MCG/ACT nasal spray  --  Pt with several days of allergic symptoms, today with involvement of her eyes.  There is swelling of the eyelids bilaterally but no erythema, induration, or warmth.  Conjunctivae are normal.  Allergic shiners bilaterally.  She has no symptoms of angioedema.  Will treat with zyrtec and flonase.    Headache - Plan: ibuprofen (ADVIL,MOTRIN) tablet 600 mg, ibuprofen (ADVIL,MOTRIN) 600 MG tablet  --   Headache does not sound like classic migraine, may be associated with her allergic symptoms.  Offered toradol injection but pt declined.  600mg  ibuprofen given in clinic.  Rx for ibuprofen provided.    Discussed RTC and ED precautions.  Pt understands and is in agreement with this plan.  Work note provided.

## 2012-11-21 ENCOUNTER — Ambulatory Visit (INDEPENDENT_AMBULATORY_CARE_PROVIDER_SITE_OTHER): Payer: 59 | Admitting: Physician Assistant

## 2012-11-21 VITALS — BP 100/66 | HR 75 | Temp 98.5°F | Resp 18 | Ht 64.5 in | Wt 179.0 lb

## 2012-11-21 DIAGNOSIS — R5383 Other fatigue: Secondary | ICD-10-CM

## 2012-11-21 DIAGNOSIS — R5381 Other malaise: Secondary | ICD-10-CM

## 2012-11-21 DIAGNOSIS — M25531 Pain in right wrist: Secondary | ICD-10-CM

## 2012-11-21 DIAGNOSIS — K219 Gastro-esophageal reflux disease without esophagitis: Secondary | ICD-10-CM

## 2012-11-21 DIAGNOSIS — M25539 Pain in unspecified wrist: Secondary | ICD-10-CM

## 2012-11-21 MED ORDER — NAPROXEN SODIUM 550 MG PO TABS: 550.0000 mg | ORAL_TABLET | Freq: Two times a day (BID) | ORAL | Status: DC

## 2012-11-21 MED ORDER — RANITIDINE HCL 150 MG PO TABS: 150.0000 mg | ORAL_TABLET | Freq: Two times a day (BID) | ORAL | Status: DC

## 2012-11-21 NOTE — Progress Notes (Signed)
Subjective:    Patient ID: Cathy Woodard, female    DOB: 12-29-1990, 22 y.o.   MRN: [id number]  HPI   Ms. Schroeck is a pleasant 22 yr old female here with several concerns:  (1)  Wrist pain - Works as a Agricultural engineer, does lots of typing.  Has developed right wrist pain intermittently over the last year, but over the last 2 days, pain has become constant.  Some numbness and weakness of fingers - thumb, index, middle finger.  She is right handed.  Admits to lots of texting with that hand.  Bought a wrist splint today.  Took 200mg  ibuprofen with no relief.    (2)  Acid reflux - Has noted reflux since she was pregnant with her daughter 4 yrs ago.  Knows that tomato based foods and mustard aggravate symptoms, but admits that sometimes symptoms come out of nowhere.  Experiences burning and "vomit" in her throat.  She has never taken medicine for this.  Usually just drinks water and symptoms calm down.    (3)  Would like thyroid checked.  Saw a doctor in [place] a few months ago who suggested she may have a thyroid problem.  She has never had this checked before.  Reports that her eye brows are very thin, which caused her to be concerned about the thyroid.  Denies cold intolerance, dry skin, hair loss.  Endorses fatigue but reports that she works 12 hr night shifts.     Review of Systems  Constitutional: Negative.   HENT: Negative.   Respiratory: Negative.   Cardiovascular: Negative.   Gastrointestinal: Negative.   Musculoskeletal: Positive for arthralgias.  Skin: Negative.   Neurological: Negative.        Objective:   Physical Exam  Vitals reviewed. Constitutional: She is oriented to person, place, and time. She appears well-developed and well-nourished. No distress.  HENT:  Head: Normocephalic and atraumatic.  Eyes: Conjunctivae are normal. No scleral icterus.  Neck: Neck supple. No thyromegaly present.  Cardiovascular: Normal rate, regular rhythm, normal heart sounds and intact  distal pulses.   Pulmonary/Chest: Effort normal and breath sounds normal. She has no wheezes. She has no rales.  Abdominal: Soft. Bowel sounds are normal. There is no tenderness.  Musculoskeletal:       Right wrist: She exhibits decreased range of motion and tenderness. She exhibits no bony tenderness, no swelling, no effusion, no crepitus and no deformity.  Diffuse TTP throughout wrist but most prominently over dorsal aspect; no bony tenderness; pain with AROM and PROM; negative Tinnel's test; ?+ Finklestein's  Neurological: She is alert and oriented to person, place, and time.  Skin: Skin is warm and dry.  Psychiatric: She has a normal mood and affect. Her behavior is normal.         Assessment & Plan:  Wrist pain, acute, right - Plan: naproxen sodium (ANAPROX) 550 MG tablet  --  22 yr old female with acute on chronic right wrist pain.  CTS seems less likely with negative Tinnel's and prominent pain at dorsal aspect of wrist.  Suspect extensor tendinitis, possibly deQuervain's with questionable +Finkelstein's.  Will initially start with conservative treatment.  Anaprox BID with food.  Ice, elevate.  May wear splint for comfort, esp while working, but I do not want her completely immobilized.  If any symptoms worsening, or if no improvement in 1-2 wks, pt to RTC.  Fatigue - Plan: TSH  --  Will check TSH per pt request   GERD (  gastroesophageal reflux disease) - Plan: ranitidine (ZANTAC) 150 MG tablet  --  Pt with intermittent GERD symptoms often triggered by foods.  Discussed avoidance of trigger foods when possible.  Pt has not tried any medication for GERD at this point.  Will start with ranitidine BID.  Consider stepping up to PPI if no improvement.

## 2012-11-21 NOTE — Patient Instructions (Addendum)
Begin taking the naproxen twice daily with food for your wrist pain.  Do not take any additional ibruprofen - you can take tylenol if needed.  Use the splint for comfort, but do not wear it all the time.  Elevate and ice the wrist when possible.  Please let us know if you are worsening or not improving in 1-2 weeks.  Use the ranitidine if needed for reflux symptoms.  If this is not controlling your heartburn, let me know because there are other medications we can try.  I will let you know when you thyroid labs are back and if we need to do anything based on them.   Tendinitis Tendinitis is swelling and inflammation of the tendons. Tendons are band-like tissues that connect muscle to bone. Tendinitis commonly occurs in the:   Shoulders (rotator cuff).  Heels (Achilles tendon).  Elbows (triceps tendon). CAUSES Tendinitis is usually caused by overusing the tendon, muscles, and joints involved. When the tissue surrounding a tendon (synovium) becomes inflamed, it is called tenosynovitis. Tendinitis commonly develops in people whose jobs require repetitive motions. SYMPTOMS  Pain.  Tenderness.  Mild swelling. DIAGNOSIS Tendinitis is usually diagnosed by physical exam. Your caregiver may also order X-rays or other imaging tests. TREATMENT Your caregiver may recommend certain medicines or exercises for your treatment. HOME CARE INSTRUCTIONS   Use a sling or splint for as long as directed by your caregiver until the pain decreases.  Put ice on the injured area.  Put ice in a plastic bag.  Place a towel between your skin and the bag.  Leave the ice on for 15-20 minutes, 3-4 times a day.  Avoid using the limb while the tendon is painful. Perform gentle range of motion exercises only as directed by your caregiver. Stop exercises if pain or discomfort increase, unless directed otherwise by your caregiver.  Only take over-the-counter or prescription medicines for pain, discomfort, or  fever as directed by your caregiver. SEEK MEDICAL CARE IF:   Your pain and swelling increase.  You develop new, unexplained symptoms, especially increased numbness in the hands. MAKE SURE YOU:   Understand these instructions.  Will watch your condition.  Will get help right away if you are not doing well or get worse. Document Released: 03/19/2000 Document Revised: 06/14/2011 Document Reviewed: 06/08/2010 [facility] Patient Information 2014 [place], [place].

## 2012-11-22 ENCOUNTER — Telehealth: Payer: Self-pay

## 2012-11-22 DIAGNOSIS — L659 Nonscarring hair loss, unspecified: Secondary | ICD-10-CM

## 2012-11-22 LAB — TSH: TSH: 1.44 u[IU]/mL (ref 0.350–4.500)

## 2012-11-22 NOTE — Telephone Encounter (Signed)
Spoke with pt, advised TSH normal. She wants to know what is the next step?

## 2012-11-22 NOTE — Telephone Encounter (Signed)
Derm ref sent, pt will receive a call about scheduling this

## 2012-11-22 NOTE — Telephone Encounter (Signed)
Yes her thyroid function is normal.  I am not sure what the cause of her thinning eyebrows is, would recommend dermatology eval

## 2012-11-22 NOTE — Telephone Encounter (Signed)
Lm for rtn call 

## 2012-11-22 NOTE — Telephone Encounter (Signed)
PT STATES SHE HAD A THYROID TEST DONE YESTERDAY AND WOULD LIKE TO KNOW IF RESULTS ARE BACK. PLEASE CALL [phone]

## 2012-11-22 NOTE — Telephone Encounter (Signed)
Called pt and she would like to see the dermatologist.

## 2013-07-10 ENCOUNTER — Encounter: Payer: Self-pay | Admitting: Gynecology

## 2013-07-10 ENCOUNTER — Ambulatory Visit (INDEPENDENT_AMBULATORY_CARE_PROVIDER_SITE_OTHER): Payer: 59 | Admitting: Gynecology

## 2013-07-10 VITALS — BP 110/70 | Resp 18 | Ht 64.25 in | Wt 191.0 lb

## 2013-07-10 DIAGNOSIS — Z01419 Encounter for gynecological examination (general) (routine) without abnormal findings: Secondary | ICD-10-CM

## 2013-07-10 DIAGNOSIS — Z0000 Encounter for general adult medical examination without abnormal findings: Secondary | ICD-10-CM

## 2013-07-10 DIAGNOSIS — Z124 Encounter for screening for malignant neoplasm of cervix: Secondary | ICD-10-CM

## 2013-07-10 LAB — HEMOGLOBIN, FINGERSTICK: HEMOGLOBIN, FINGERSTICK: 12.1 g/dL (ref 12.0–16.0)

## 2013-07-10 LAB — POCT URINALYSIS DIPSTICK
LEUKOCYTES UA: NEGATIVE
PH UA: 5
Urobilinogen, UA: NEGATIVE

## 2013-07-10 NOTE — Progress Notes (Signed)
23 y.o. Single African American female   [phone] here for annual exam. Pt is not currently sexually active.  She reports  using condoms on a regular basis.  First sexual activity at 23  years old, 5  number of lifetime partners.  Last sexual activity months ago. Pt was using condoms.  Pt had ectopic pregnancy 2009, treated with MTX.   Patient's last menstrual period was 07/06/2013.          Sexually active: yes  The current method of family planning is none.    Exercising: no  The patient does not participate in regular exercise at present. Last pap: 2013 WNL  Alcohol: 3 drinks/wk Tobacco: no Drugs: no Gardisil: yes, completed: Age 85/16   Hgb: 12.1 ; Urine: Negative    Health Maintenance  Topic Date Due  . Pap Smear  03/19/2007  . Tetanus/tdap  03/18/2008  . Influenza Vaccine  11/03/2013    Family History  Problem Relation Age of Onset  . Diabetes Maternal Grandmother   . Anesthesia problems Neg Hx   . Hypotension Neg Hx   . Malignant hyperthermia Neg Hx   . Pseudochol deficiency Neg Hx   . Bone cancer Maternal Grandmother 52  . Cancer Maternal Uncle     not sure  . Cancer Maternal Grandfather     not sure  . Diabetes Maternal Grandmother     There are no active problems to display for this patient.   Past Medical History  Diagnosis Date  . Hypercholesteremia   . No pertinent past medical history   . Allergy     Past Surgical History  Procedure Laterality Date  . Cesarean section      Allergies: Anaprox and Benadryl  Current Outpatient Prescriptions  Medication Sig Dispense Refill  . fluticasone (FLONASE) 50 MCG/ACT nasal spray Place 2 sprays into the nose daily.  16 g  12  . ibuprofen (ADVIL,MOTRIN) 600 MG tablet Take 1 tablet (600 mg total) by mouth every 8 (eight) hours as needed for pain.  30 tablet  0   Current Facility-Administered Medications  Medication Dose Route Frequency Provider Last Rate Last Dose  . ibuprofen (ADVIL,MOTRIN) tablet 600 mg   600 mg Oral Once [employer], PA-C        ROS: Pertinent items are noted in HPI.  Exam:    BP 110/70  Resp 18  Ht 5' 4.25" (1.632 m)  Wt 191 lb (86.637 kg)  BMI 32.53 kg/m2  LMP 07/06/2013 Weight change: @WEIGHTCHANGE @ Last 3 height recordings:  Ht Readings from Last 3 Encounters:  07/10/13 5' 4.25" (1.632 m)  11/21/12 5' 4.5" (1.638 m)  07/19/12 5' 4.5" (1.638 m)   General appearance: alert, cooperative and appears stated age Head: Normocephalic, without obvious abnormality, atraumatic Neck: no adenopathy, no carotid bruit, no JVD, supple, symmetrical, trachea midline and thyroid not enlarged, symmetric, no tenderness/mass/nodules Lungs: clear to auscultation bilaterally Breasts: normal appearance, no masses or tenderness Heart: regular rate and rhythm, S1, S2 normal, no murmur, click, rub or gallop Abdomen: soft, non-tender; bowel sounds normal; no masses,  no organomegaly Extremities: extremities normal, atraumatic, no cyanosis or edema Skin: Skin color, texture, turgor normal. No rashes or lesions Lymph nodes: Cervical, supraclavicular, and axillary nodes normal. no inguinal nodes palpated Neurologic: Grossly normal   Pelvic: External genitalia:  no lesions              Urethra: normal appearing urethra with no masses, tenderness or lesions  Bartholins and Skenes: Bartholin's, Urethra, Skene's normal                 Vagina: normal appearing vagina with normal color and discharge, no lesions              Cervix: normal appearance and evidence of prior rip at 1 o'clock              Pap taken: yes        Bimanual Exam:  Uterus:  uterus is normal size, shape, consistency and nontender                                      Adnexa:    normal adnexa in size, nontender and no masses                                      Rectovaginal: Deferred                                      Anus:  defer exam  A: well woman Contraceptive management     P: pap smear with  reflex Discussed contraceptive options if she should become active again, pt had mirena placed after c/s but removed after 2d due to severe pain, pt would consider replacing, info on nexplanon and mirena provided counseled on breast self exam, adequate intake of calcium and vitamin D, diet and exercise return annually or prn Discussed STD prevention, regular condom use.    An After Visit Summary was printed and given to the patient.

## 2013-07-10 NOTE — Patient Instructions (Signed)
Etonogestrel implant What is this medicine? ETONOGESTREL (et oh noe JES trel) is a contraceptive (birth control) device. It is used to prevent pregnancy. It can be used for up to 3 years. This medicine may be used for other purposes; ask your health care provider or pharmacist if you have questions. COMMON BRAND NAME(S): Implanon, Nexplanon  What should I tell my health care provider before I take this medicine? They need to know if you have any of these conditions: -abnormal vaginal bleeding -blood vessel disease or blood clots -cancer of the breast, cervix, or liver -depression -diabetes -gallbladder disease -headaches -heart disease or recent heart attack -high blood pressure -high cholesterol -kidney disease -liver disease -renal disease -seizures -tobacco smoker -an unusual or allergic reaction to etonogestrel, other hormones, anesthetics or antiseptics, medicines, foods, dyes, or preservatives -pregnant or trying to get pregnant -breast-feeding How should I use this medicine? This device is inserted just under the skin on the inner side of your upper arm by a health care professional. Talk to your pediatrician regarding the use of this medicine in children. Special care may be needed. Overdosage: If you think you've taken too much of this medicine contact a poison control center or emergency room at once. Overdosage: If you think you have taken too much of this medicine contact a poison control center or emergency room at once. NOTE: This medicine is only for you. Do not share this medicine with others. What if I miss a dose? This does not apply. What may interact with this medicine? Do not take this medicine with any of the following medications: -amprenavir -bosentan -fosamprenavir This medicine may also interact with the following medications: -barbiturate medicines for inducing sleep or treating seizures -certain medicines for fungal infections like ketoconazole and  itraconazole -griseofulvin -medicines to treat seizures like carbamazepine, felbamate, oxcarbazepine, phenytoin, topiramate -modafinil -phenylbutazone -rifampin -some medicines to treat HIV infection like atazanavir, indinavir, lopinavir, nelfinavir, tipranavir, ritonavir -St. John's wort This list may not describe all possible interactions. Give your health care provider a list of all the medicines, herbs, non-prescription drugs, or dietary supplements you use. Also tell them if you smoke, drink alcohol, or use illegal drugs. Some items may interact with your medicine. What should I watch for while using this medicine? This product does not protect you against HIV infection (AIDS) or other sexually transmitted diseases. You should be able to feel the implant by pressing your fingertips over the skin where it was inserted. Tell your doctor if you cannot feel the implant. What side effects may I notice from receiving this medicine? Side effects that you should report to your doctor or health care professional as soon as possible: -allergic reactions like skin rash, itching or hives, swelling of the face, lips, or tongue -breast lumps -changes in vision -confusion, trouble speaking or understanding -dark urine -depressed mood -general ill feeling or flu-like symptoms -light-colored stools -loss of appetite, nausea -right upper belly pain -severe headaches -severe pain, swelling, or tenderness in the abdomen -shortness of breath, chest pain, swelling in a leg -signs of pregnancy -sudden numbness or weakness of the face, arm or leg -trouble walking, dizziness, loss of balance or coordination -unusual vaginal bleeding, discharge -unusually weak or tired -yellowing of the eyes or skin Side effects that usually do not require medical attention (Report these to your doctor or health care professional if they continue or are bothersome.): -acne -breast pain -changes in  weight -cough -fever or chills -headache -irregular menstrual bleeding -itching, burning,   and vaginal discharge -pain or difficulty passing urine -sore throat This list may not describe all possible side effects. Call your doctor for medical advice about side effects. You may report side effects to FDA at 1-800-FDA-1088. Where should I keep my medicine? This drug is given in a hospital or clinic and will not be stored at home. NOTE: This sheet is a summary. It may not cover all possible information. If you have questions about this medicine, talk to your doctor, pharmacist, or health care provider.  2014, Elsevier/Gold Standard. (2011-09-27 15:37:45) Levonorgestrel intrauterine device (IUD) What is this medicine? LEVONORGESTREL IUD (LEE voe nor jes trel) is a contraceptive (birth control) device. The device is placed inside the uterus by a healthcare professional. It is used to prevent pregnancy and can also be used to treat heavy bleeding that occurs during your period. Depending on the device, it can be used for 3 to 5 years. This medicine may be used for other purposes; ask your health care provider or pharmacist if you have questions. COMMON BRAND NAME(S): Mirena, Skyla What should I tell my health care provider before I take this medicine? They need to know if you have any of these conditions: -abnormal Pap smear -cancer of the breast, uterus, or cervix -diabetes -endometritis -genital or pelvic infection now or in the past -have more than one sexual partner or your partner has more than one partner -heart disease -history of an ectopic or tubal pregnancy -immune system problems -IUD in place -liver disease or tumor -problems with blood clots or take blood-thinners -use intravenous drugs -uterus of unusual shape -vaginal bleeding that has not been explained -an unusual or allergic reaction to levonorgestrel, other hormones, silicone, or polyethylene, medicines, foods, dyes,  or preservatives -pregnant or trying to get pregnant -breast-feeding How should I use this medicine? This device is placed inside the uterus by a health care professional. Talk to your pediatrician regarding the use of this medicine in children. Special care may be needed. Overdosage: If you think you have taken too much of this medicine contact a poison control center or emergency room at once. NOTE: This medicine is only for you. Do not share this medicine with others. What if I miss a dose? This does not apply. What may interact with this medicine? Do not take this medicine with any of the following medications: -amprenavir -bosentan -fosamprenavir This medicine may also interact with the following medications: -aprepitant -barbiturate medicines for inducing sleep or treating seizures -bexarotene -griseofulvin -medicines to treat seizures like carbamazepine, ethotoin, felbamate, oxcarbazepine, phenytoin, topiramate -modafinil -pioglitazone -rifabutin -rifampin -rifapentine -some medicines to treat HIV infection like atazanavir, indinavir, lopinavir, nelfinavir, tipranavir, ritonavir -St. John's wort -warfarin This list may not describe all possible interactions. Give your health care provider a list of all the medicines, herbs, non-prescription drugs, or dietary supplements you use. Also tell them if you smoke, drink alcohol, or use illegal drugs. Some items may interact with your medicine. What should I watch for while using this medicine? Visit your doctor or health care professional for regular check ups. See your doctor if you or your partner has sexual contact with others, becomes HIV positive, or gets a sexual transmitted disease. This product does not protect you against HIV infection (AIDS) or other sexually transmitted diseases. You can check the placement of the IUD yourself by reaching up to the top of your vagina with clean fingers to feel the threads. Do not pull on  the threads. It is a good   habit to check placement after each menstrual period. Call your doctor right away if you feel more of the IUD than just the threads or if you cannot feel the threads at all. The IUD may come out by itself. You may become pregnant if the device comes out. If you notice that the IUD has come out use a backup birth control method like condoms and call your health care provider. Using tampons will not change the position of the IUD and are okay to use during your period. What side effects may I notice from receiving this medicine? Side effects that you should report to your doctor or health care professional as soon as possible: -allergic reactions like skin rash, itching or hives, swelling of the face, lips, or tongue -fever, flu-like symptoms -genital sores -high blood pressure -no menstrual period for 6 weeks during use -pain, swelling, warmth in the leg -pelvic pain or tenderness -severe or sudden headache -signs of pregnancy -stomach cramping -sudden shortness of breath -trouble with balance, talking, or walking -unusual vaginal bleeding, discharge -yellowing of the eyes or skin Side effects that usually do not require medical attention (report to your doctor or health care professional if they continue or are bothersome): -acne -breast pain -change in sex drive or performance -changes in weight -cramping, dizziness, or faintness while the device is being inserted -headache -irregular menstrual bleeding within first 3 to 6 months of use -nausea This list may not describe all possible side effects. Call your doctor for medical advice about side effects. You may report side effects to FDA at 1-800-FDA-1088. Where should I keep my medicine? This does not apply. NOTE: This sheet is a summary. It may not cover all possible information. If you have questions about this medicine, talk to your doctor, pharmacist, or health care provider.  2014, Elsevier/Gold  Standard. (2011-04-22 13:54:04)  

## 2013-07-11 LAB — IPS PAP TEST WITH REFLEX TO HPV

## 2013-07-13 ENCOUNTER — Telehealth: Payer: Self-pay | Admitting: *Deleted

## 2013-07-13 MED ORDER — TINIDAZOLE 500 MG PO TABS: 500.0000 mg | ORAL_TABLET | Freq: Once | ORAL | Status: DC

## 2013-07-13 NOTE — Telephone Encounter (Signed)
Patient notified would like to have rx called in.  [facility]am[facility] 500 mg #8/0 [facility]   Patient aware to take 4 tablets all at once and then repeat in 1 day.  Routed to provider for review, encounter closed.

## 2013-07-13 NOTE — Telephone Encounter (Signed)
Message copied by Lorraine LaxSHAW, Ahniya Mitchum J on Fri Jul 13, 2013 11:02 AM ------      Message from: Douglass RiversLATHROP, TRACY      Created: Wed Jul 11, 2013  4:39 PM       Inform bv on pap, can treat if desires, tindamax 500mg  4 tab at once repeat 1d #8 ------

## 2014-02-04 ENCOUNTER — Encounter: Payer: Self-pay | Admitting: Gynecology

## 2014-07-05 ENCOUNTER — Ambulatory Visit (INDEPENDENT_AMBULATORY_CARE_PROVIDER_SITE_OTHER): Payer: 59 | Admitting: Family Medicine

## 2014-07-05 VITALS — BP 110/78 | HR 95 | Temp 97.9°F | Resp 16 | Ht 64.0 in | Wt 205.0 lb

## 2014-07-05 DIAGNOSIS — Z3401 Encounter for supervision of normal first pregnancy, first trimester: Secondary | ICD-10-CM

## 2014-07-05 DIAGNOSIS — R1030 Lower abdominal pain, unspecified: Secondary | ICD-10-CM

## 2014-07-05 DIAGNOSIS — M545 Low back pain: Secondary | ICD-10-CM

## 2014-07-05 LAB — POCT URINALYSIS DIPSTICK
Bilirubin, UA: NEGATIVE
Blood, UA: NEGATIVE
Glucose, UA: NEGATIVE
Ketones, UA: NEGATIVE
Nitrite, UA: NEGATIVE
Protein, UA: NEGATIVE
Spec Grav, UA: 1.015
Urobilinogen, UA: 0.2
pH, UA: 7

## 2014-07-05 LAB — POCT UA - MICROSCOPIC ONLY
Casts, Ur, LPF, POC: NEGATIVE
Crystals, Ur, HPF, POC: NEGATIVE
Mucus, UA: NEGATIVE
RBC, urine, microscopic: NEGATIVE
WBC, Ur, HPF, POC: NEGATIVE
Yeast, UA: NEGATIVE

## 2014-07-05 NOTE — Progress Notes (Signed)
This chart was scribed for Dr. Elvina SidleKurt Lauenstein, MD by Jarvis Morganaylor Ferguson, Medical Scribe. This patient was seen in Room 10 and the patient's care was started at 3:53 PM.  Patient ID: Cathy Woodard MRN: [id number], DOB: [birth year], 24 y.o. Date of Encounter: 07/05/2014, 3:51 PM  Primary Physician: No PCP Per Patient  Chief Complaint:  Chief Complaint  Patient presents with   Abdominal Pain    cramping and lower back pain x 1 week.  about [redacted] wks pregnant.  told to come in and get checked out.     HPI: 24 y.o. year old female with history below presents with lower abdominal pain and back pain for 1 week. Pt is currently pregnant and does does not know for certain how many weeks but believes to be about [redacted] wks pregnant. She reports her LNMP was 05/17/14. This is her second pregnancy. Pt denies pain being worse when she eats. Pt is worried because she has miscarried in the past and has also had an ectopic pregnancy. Pt states that she had vaginal bleeding with her last ectopic pregnancy. Pt states her gynecology office is [facility]. She denies any dysuria, pain with bowel movements, nausea, vomiting, diarrhea, vaginal bleeding or vaginal discharge.   Pt works for [employer]   Past Medical History  Diagnosis Date   Hypercholesteremia    No pertinent past medical history    Allergy      Home Meds: Prior to Admission medications   Medication Sig Start Date End Date Taking? Authorizing Provider  fluticasone (FLONASE) 50 MCG/ACT nasal spray Place 2 sprays into the nose daily. 07/19/12  Yes Eleanore E Debbra RidingEgan, PA-C  ibuprofen (ADVIL,MOTRIN) 600 MG tablet Take 1 tablet (600 mg total) by mouth every 8 (eight) hours as needed for pain. 07/19/12  Yes Godfrey PickEleanore E Egan, PA-C    Allergies:  Allergies  Allergen Reactions   Anaprox [Naproxen Sodium] Rash   Benadryl [Diphenhydramine Hcl] Rash    History   Social History   Marital Status: Single    Spouse Name: N/A   Number  of Children: N/A   Years of Education: N/A   Occupational History   Not on file.   Social History Main Topics   Smoking status: Never Smoker    Smokeless tobacco: Never Used   Alcohol Use: 1.8 oz/week    3 Standard drinks or equivalent per week     Comment: Occas   Drug Use: No   Sexual Activity: Not Currently    Birth Control/ Protection: None     Comment: Started BCP this AM   Other Topics Concern   Not on file   Social History Narrative     Review of Systems: Constitutional: negative for chills, fever, night sweats, weight changes, or fatigue  HEENT: negative for vision changes, hearing loss, congestion, rhinorrhea, ST, epistaxis, or sinus pressure Cardiovascular: negative for chest pain or palpitations Respiratory: negative for hemoptysis, wheezing, shortness of breath, or cough Abdominal: positive for abdominal pain. negative for nausea, vomiting, diarrhea, or constipation Dermatological: negative for rash Neurologic: negative for headache, dizziness, or syncope Musk: positive for back pain GU: negative for dysuria, vaginal discharge, vaginal bleeding All other systems reviewed and are otherwise negative with the exception to those above and in the HPI.   Physical Exam: Blood pressure 110/78, pulse 95, temperature 97.9 F (36.6 C), temperature source Oral, resp. rate 16, height 5\' 4"  (1.626 m), weight 205 lb (92.987 kg), last menstrual period 05/17/2014, SpO2  98 %., Body mass index is 35.17 kg/(m^2). General: Well developed, well nourished, in no acute distress. Head: Normocephalic, atraumatic, eyes without discharge, sclera non-icteric, nares are without discharge. Bilateral auditory canals clear, TM's are without perforation, pearly grey and translucent with reflective cone of light bilaterally. Oral cavity moist, posterior pharynx without exudate, erythema, peritonsillar abscess, or post nasal drip.  Neck: Supple. No thyromegaly. Full ROM. No  lymphadenopathy. Lungs: Clear bilaterally to auscultation without wheezes, rales, or rhonchi. Breathing is unlabored. Heart: RRR with S1 S2. No murmurs, rubs, or gallops appreciated. Abdomen: Soft, non-tender, non-distended with normoactive bowel sounds. No rebound/guarding.  Moderate obesity hinders complete exam.  Msk:  Strength and tone normal for age. Extremities/Skin: Warm and dry. No clubbing or cyanosis. No edema. No rashes or suspicious lesions. Neuro: Alert and oriented X 3. Moves all extremities spontaneously. Gait is normal. CNII-XII grossly in tact. Psych:  Responds to questions appropriately with a normal affect.   Labs: Results for orders placed or performed in visit on 07/05/14  POCT UA - Microscopic Only  Result Value Ref Range   WBC, Ur, HPF, POC neg    RBC, urine, microscopic neg    Bacteria, U Microscopic trace    Mucus, UA neg    Epithelial cells, urine per micros 3-6    Crystals, Ur, HPF, POC neg    Casts, Ur, LPF, POC neg    Yeast, UA neg   POCT urinalysis dipstick  Result Value Ref Range   Color, UA yellow    Clarity, UA clear    Glucose, UA neg    Bilirubin, UA neg    Ketones, UA neg    Spec Grav, UA 1.015    Blood, UA neg    pH, UA 7.0    Protein, UA neg    Urobilinogen, UA 0.2    Nitrite, UA neg    Leukocytes, UA Trace   '    ASSESSMENT AND PLAN:  24 y.o. year old female with   This chart was scribed in my presence and reviewed by me personally.    ICD-9-CM ICD-10-CM   1. Low back pain without sciatica, unspecified back pain laterality 724.2 M54.5 POCT UA - Microscopic Only     POCT urinalysis dipstick     hCG, quantitative, pregnancy  2. Encounter for supervision of normal first pregnancy in first trimester V22.0 Z34.01 hCG, quantitative, pregnancy     Signed, Elvina Sidle, MD   Signed, Elvina Sidle, MD 07/05/2014 3:51 PM

## 2014-07-05 NOTE — Patient Instructions (Signed)
Keep your appointment with your obstetrician on Monday. If he started having any bleeding between now and Monday, go to [facility] to have [facility] ultrasound We are performing a quantitative hCG to make sure that this is a viable and healthy pregnancy. Testes be repeated in 48 hours and the level should double.

## 2014-07-06 LAB — HCG, QUANTITATIVE, PREGNANCY: hCG, Beta Chain, Quant, S: 18564.4 m[IU]/mL

## 2014-07-16 LAB — OB RESULTS CONSOLE RUBELLA ANTIBODY, IGM: RUBELLA: IMMUNE

## 2014-07-29 ENCOUNTER — Other Ambulatory Visit: Payer: Self-pay | Admitting: Obstetrics and Gynecology

## 2014-07-31 LAB — CYTOLOGY - PAP

## 2015-01-11 ENCOUNTER — Inpatient Hospital Stay (HOSPITAL_COMMUNITY)
Admission: AD | Admit: 2015-01-11 | Discharge: 2015-01-11 | Disposition: A | Discharge status: Home / Self Care | Payer: 59 | Source: Ambulatory Visit | Attending: Obstetrics and Gynecology | Admitting: Obstetrics and Gynecology

## 2015-01-11 ENCOUNTER — Encounter (HOSPITAL_COMMUNITY): Payer: Self-pay

## 2015-01-11 DIAGNOSIS — O479 False labor, unspecified: Secondary | ICD-10-CM | POA: Diagnosis not present

## 2015-01-11 DIAGNOSIS — Z3A34 34 weeks gestation of pregnancy: Secondary | ICD-10-CM | POA: Diagnosis not present

## 2015-01-11 DIAGNOSIS — O4703 False labor before 37 completed weeks of gestation, third trimester: Secondary | ICD-10-CM | POA: Insufficient documentation

## 2015-01-11 LAB — URINALYSIS, ROUTINE W REFLEX MICROSCOPIC
BILIRUBIN URINE: NEGATIVE
GLUCOSE, UA: NEGATIVE mg/dL
Hgb urine dipstick: NEGATIVE
Ketones, ur: NEGATIVE mg/dL
LEUKOCYTES UA: NEGATIVE
NITRITE: NEGATIVE
PH: 5.5 (ref 5.0–8.0)
Protein, ur: NEGATIVE mg/dL
SPECIFIC GRAVITY, URINE: 1.01 (ref 1.005–1.030)
Urobilinogen, UA: 0.2 mg/dL (ref 0.0–1.0)

## 2015-01-11 MED ORDER — TERBUTALINE SULFATE 1 MG/ML IJ SOLN: 0.2500 mg | Freq: Once | INTRAMUSCULAR | Status: DC

## 2015-01-11 NOTE — MAU Note (Signed)
Came in by EMS. Contractions throughout the day,  Got stronger and more frequent at midnight.  At 3 am started having 5 contractions in 30 min. No bleeding. No leaking.  Baby moving well.

## 2015-01-11 NOTE — Discharge Instructions (Signed)
Stay well - hydrated, drinking water is best.  No sex, pelvic rest!  Keep doctor appointments. Third Trimester of Pregnancy The third trimester is from week 29 through week 42, months 7 through 9. The third trimester is a time when the fetus is growing rapidly. At the end of the ninth month, the fetus is about 20 inches in length and weighs 6-10 pounds.  BODY CHANGES Your body goes through many changes during pregnancy. The changes vary from woman to woman.   Your weight will continue to increase. You can expect to gain 25-35 pounds (11-16 kg) by the end of the pregnancy.  You may begin to get stretch marks on your hips, abdomen, and breasts.  You may urinate more often because the fetus is moving lower into your pelvis and pressing on your bladder.  You may develop or continue to have heartburn as a result of your pregnancy.  You may develop constipation because certain hormones are causing the muscles that push waste through your intestines to slow down.  You may develop hemorrhoids or swollen, bulging veins (varicose veins).  You may have pelvic pain because of the weight gain and pregnancy hormones relaxing your joints between the bones in your pelvis. Backaches may result from overexertion of the muscles supporting your posture.  You may have changes in your hair. These can include thickening of your hair, rapid growth, and changes in texture. Some women also have hair loss during or after pregnancy, or hair that feels dry or thin. Your hair will most likely return to normal after your baby is born.  Your breasts will continue to grow and be tender. A yellow discharge may leak from your breasts called colostrum.  Your belly button may stick out.  You may feel short of breath because of your expanding uterus.  You may notice the fetus "dropping," or moving lower in your abdomen.  You may have a bloody mucus discharge. This usually occurs a few days to a week before labor  begins.  Your cervix becomes thin and soft (effaced) near your due date. WHAT TO EXPECT AT YOUR PRENATAL EXAMS  You will have prenatal exams every 2 weeks until week 36. Then, you will have weekly prenatal exams. During a routine prenatal visit:  You will be weighed to make sure you and the fetus are growing normally.  Your blood pressure is taken.  Your abdomen will be measured to track your baby's growth.  The fetal heartbeat will be listened to.  Any test results from the previous visit will be discussed.  You may have a cervical check near your due date to see if you have effaced. At around 36 weeks, your caregiver will check your cervix. At the same time, your caregiver will also perform a test on the secretions of the vaginal tissue. This test is to determine if a type of bacteria, Group B streptococcus, is present. Your caregiver will explain this further. Your caregiver may ask you:  What your birth plan is.  How you are feeling.  If you are feeling the baby move.  If you have had any abnormal symptoms, such as leaking fluid, bleeding, severe headaches, or abdominal cramping.  If you are using any tobacco products, including cigarettes, chewing tobacco, and electronic cigarettes.  If you have any questions. Other tests or screenings that may be performed during your third trimester include:  Blood tests that check for low iron levels (anemia).  Fetal testing to check the health, activity  level, and growth of the fetus. Testing is done if you have certain medical conditions or if there are problems during the pregnancy.  HIV (human immunodeficiency virus) testing. If you are at high risk, you may be screened for HIV during your third trimester of pregnancy. FALSE LABOR You may feel small, irregular contractions that eventually go away. These are called Braxton Hicks contractions, or false labor. Contractions may last for hours, days, or even weeks before true labor sets  in. If contractions come at regular intervals, intensify, or become painful, it is best to be seen by your caregiver.  SIGNS OF LABOR   Menstrual-like cramps.  Contractions that are 5 minutes apart or less.  Contractions that start on the top of the uterus and spread down to the lower abdomen and back.  A sense of increased pelvic pressure or back pain.  A watery or bloody mucus discharge that comes from the vagina. If you have any of these signs before the 37th week of pregnancy, call your caregiver right away. You need to go to the hospital to get checked immediately. HOME CARE INSTRUCTIONS   Avoid all smoking, herbs, alcohol, and unprescribed drugs. These chemicals affect the formation and growth of the baby.  Do not use any tobacco products, including cigarettes, chewing tobacco, and electronic cigarettes. If you need help quitting, ask your health care provider. You may receive counseling support and other resources to help you quit.  Follow your caregiver's instructions regarding medicine use. There are medicines that are either safe or unsafe to take during pregnancy.  Exercise only as directed by your caregiver. Experiencing uterine cramps is a good sign to stop exercising.  Continue to eat regular, healthy meals.  Wear a good support bra for breast tenderness.  Do not use hot tubs, steam rooms, or saunas.  Wear your seat belt at all times when driving.  Avoid raw meat, uncooked cheese, cat litter boxes, and soil used by cats. These carry germs that can cause birth defects in the baby.  Take your prenatal vitamins.  Take 1500-2000 mg of calcium daily starting at the 20th week of pregnancy until you deliver your baby.  Try taking a stool softener (if your caregiver approves) if you develop constipation. Eat more high-fiber foods, such as fresh vegetables or fruit and whole grains. Drink plenty of fluids to keep your urine clear or pale yellow.  Take warm sitz baths to  soothe any pain or discomfort caused by hemorrhoids. Use hemorrhoid cream if your caregiver approves.  If you develop varicose veins, wear support hose. Elevate your feet for 15 minutes, 3-4 times a day. Limit salt in your diet.  Avoid heavy lifting, wear low heal shoes, and practice good posture.  Rest a lot with your legs elevated if you have leg cramps or low back pain.  Visit your dentist if you have not gone during your pregnancy. Use a soft toothbrush to brush your teeth and be gentle when you floss.  A sexual relationship may be continued unless your caregiver directs you otherwise.  Do not travel far distances unless it is absolutely necessary and only with the approval of your caregiver.  Take prenatal classes to understand, practice, and ask questions about the labor and delivery.  Make a trial run to the hospital.  Pack your hospital bag.  Prepare the baby's nursery.  Continue to go to all your prenatal visits as directed by your caregiver. SEEK MEDICAL CARE IF:  You are unsure if  you are in labor or if your water has broken.  You have dizziness.  You have mild pelvic cramps, pelvic pressure, or nagging pain in your abdominal area.  You have persistent nausea, vomiting, or diarrhea.  You have a bad smelling vaginal discharge.  You have pain with urination. SEEK IMMEDIATE MEDICAL CARE IF:   You have a fever.  You are leaking fluid from your vagina.  You have spotting or bleeding from your vagina.  You have severe abdominal cramping or pain.  You have rapid weight loss or gain.  You have shortness of breath with chest pain.  You notice sudden or extreme swelling of your face, hands, ankles, feet, or legs.  You have not felt your baby move in over an hour.  You have severe headaches that do not go away with medicine.  You have vision changes.   This information is not intended to replace advice given to you by your health care provider. Make sure you  discuss any questions you have with your health care provider.   Document Released: 03/16/2001 Document Revised: 04/12/2014 Document Reviewed: 05/23/2012 Elsevier Interactive Patient Education 2016 [employer] Fetal Movement Counts Patient Name: __________________________________________________ Patient Due Date: ____________________ Performing a fetal movement count is highly recommended in high-risk pregnancies, but it is good for every pregnant woman to do. Your health care provider may ask you to start counting fetal movements at 28 weeks of the pregnancy. Fetal movements often increase:  After eating a full meal.  After physical activity.  After eating or drinking something sweet or cold.  At rest. Pay attention to when you feel the baby is most active. This will help you notice a pattern of your baby's sleep and wake cycles and what factors contribute to an increase in fetal movement. It is important to perform a fetal movement count at the same time each day when your baby is normally most active.  HOW TO COUNT FETAL MOVEMENTS  Find a quiet and comfortable area to sit or lie down on your left side. Lying on your left side provides the best blood and oxygen circulation to your baby.  Write down the day and time on a sheet of paper or in a journal.  Start counting kicks, flutters, swishes, rolls, or jabs in a 2-hour period. You should feel at least 10 movements within 2 hours.  If you do not feel 10 movements in 2 hours, wait 2-3 hours and count again. Look for a change in the pattern or not enough counts in 2 hours. SEEK MEDICAL CARE IF:  You feel less than 10 counts in 2 hours, tried twice.  There is no movement in over an hour.  The pattern is changing or taking longer each day to reach 10 counts in 2 hours.  You feel the baby is not moving as he or she usually does. Date: ____________ Movements: ____________ Start time: ____________ Doreatha Martin time: ____________  Date:  ____________ Movements: ____________ Start time: ____________ Doreatha Martin time: ____________ Date: ____________ Movements: ____________ Start time: ____________ Doreatha Martin time: ____________ Date: ____________ Movements: ____________ Start time: ____________ Doreatha Martin time: ____________ Date: ____________ Movements: ____________ Start time: ____________ Doreatha Martin time: ____________ Date: ____________ Movements: ____________ Start time: ____________ Doreatha Martin time: ____________ Date: ____________ Movements: ____________ Start time: ____________ Doreatha Martin time: ____________ Date: ____________ Movements: ____________ Start time: ____________ Doreatha Martin time: ____________  Date: ____________ Movements: ____________ Start time: ____________ Doreatha Martin time: ____________ Date: ____________ Movements: ____________ Start time: ____________ Doreatha Martin time: ____________ Date: ____________ Movements: ____________  Start time: ____________ Doreatha Martin time: ____________ Date: ____________ Movements: ____________ Start time: ____________ Doreatha Martin time: ____________ Date: ____________ Movements: ____________ Start time: ____________ Doreatha Martin time: ____________ Date: ____________ Movements: ____________ Start time: ____________ Doreatha Martin time: ____________ Date: ____________ Movements: ____________ Start time: ____________ Doreatha Martin time: ____________  Date: ____________ Movements: ____________ Start time: ____________ Doreatha Martin time: ____________ Date: ____________ Movements: ____________ Start time: ____________ Doreatha Martin time: ____________ Date: ____________ Movements: ____________ Start time: ____________ Doreatha Martin time: ____________ Date: ____________ Movements: ____________ Start time: ____________ Doreatha Martin time: ____________ Date: ____________ Movements: ____________ Start time: ____________ Doreatha Martin time: ____________ Date: ____________ Movements: ____________ Start time: ____________ Doreatha Martin time: ____________ Date: ____________ Movements: ____________ Start  time: ____________ Doreatha Martin time: ____________  Date: ____________ Movements: ____________ Start time: ____________ Doreatha Martin time: ____________ Date: ____________ Movements: ____________ Start time: ____________ Doreatha Martin time: ____________ Date: ____________ Movements: ____________ Start time: ____________ Doreatha Martin time: ____________ Date: ____________ Movements: ____________ Start time: ____________ Doreatha Martin time: ____________ Date: ____________ Movements: ____________ Start time: ____________ Doreatha Martin time: ____________ Date: ____________ Movements: ____________ Start time: ____________ Doreatha Martin time: ____________ Date: ____________ Movements: ____________ Start time: ____________ Doreatha Martin time: ____________  Date: ____________ Movements: ____________ Start time: ____________ Doreatha Martin time: ____________ Date: ____________ Movements: ____________ Start time: ____________ Doreatha Martin time: ____________ Date: ____________ Movements: ____________ Start time: ____________ Doreatha Martin time: ____________ Date: ____________ Movements: ____________ Start time: ____________ Doreatha Martin time: ____________ Date: ____________ Movements: ____________ Start time: ____________ Doreatha Martin time: ____________ Date: ____________ Movements: ____________ Start time: ____________ Doreatha Martin time: ____________ Date: ____________ Movements: ____________ Start time: ____________ Doreatha Martin time: ____________  Date: ____________ Movements: ____________ Start time: ____________ Doreatha Martin time: ____________ Date: ____________ Movements: ____________ Start time: ____________ Doreatha Martin time: ____________ Date: ____________ Movements: ____________ Start time: ____________ Doreatha Martin time: ____________ Date: ____________ Movements: ____________ Start time: ____________ Doreatha Martin time: ____________ Date: ____________ Movements: ____________ Start time: ____________ Doreatha Martin time: ____________ Date: ____________ Movements: ____________ Start time: ____________ Doreatha Martin time:  ____________ Date: ____________ Movements: ____________ Start time: ____________ Doreatha Martin time: ____________  Date: ____________ Movements: ____________ Start time: ____________ Doreatha Martin time: ____________ Date: ____________ Movements: ____________ Start time: ____________ Doreatha Martin time: ____________ Date: ____________ Movements: ____________ Start time: ____________ Doreatha Martin time: ____________ Date: ____________ Movements: ____________ Start time: ____________ Doreatha Martin time: ____________ Date: ____________ Movements: ____________ Start time: ____________ Doreatha Martin time: ____________ Date: ____________ Movements: ____________ Start time: ____________ Doreatha Martin time: ____________ Date: ____________ Movements: ____________ Start time: ____________ Doreatha Martin time: ____________  Date: ____________ Movements: ____________ Start time: ____________ Doreatha Martin time: ____________ Date: ____________ Movements: ____________ Start time: ____________ Doreatha Martin time: ____________ Date: ____________ Movements: ____________ Start time: ____________ Doreatha Martin time: ____________ Date: ____________ Movements: ____________ Start time: ____________ Doreatha Martin time: ____________ Date: ____________ Movements: ____________ Start time: ____________ Doreatha Martin time: ____________ Date: ____________ Movements: ____________ Start time: ____________ Doreatha Martin time: ____________   This information is not intended to replace advice given to you by your health care provider. Make sure you discuss any questions you have with your health care provider.   Document Released: 04/21/2006 Document Revised: 04/12/2014 Document Reviewed: 01/17/2012 Elsevier Interactive Patient Education 2016 [employer]. Preterm Labor Information Preterm labor is when labor starts at less than 37 weeks of pregnancy. The normal length of a pregnancy is 39 to 41 weeks. CAUSES Often, there is no identifiable underlying cause as to why a woman goes into preterm labor. One of the most common  known causes of preterm labor is infection. Infections of the uterus, cervix, vagina, amniotic sac, bladder, kidney, or even the lungs (pneumonia) can cause labor to start. Other suspected causes of preterm labor include:   Urogenital  infections, such as yeast infections and bacterial vaginosis.   Uterine abnormalities (uterine shape, uterine septum, fibroids, or bleeding from the placenta).   A cervix that has been operated on (it may fail to stay closed).   Malformations in the fetus.   Multiple gestations (twins, triplets, and so on).   Breakage of the amniotic sac.  RISK FACTORS  Having a previous history of preterm labor.   Having premature rupture of membranes (PROM).   Having a placenta that covers the opening of the cervix (placenta previa).   Having a placenta that separates from the uterus (placental abruption).   Having a cervix that is too weak to hold the fetus in the uterus (incompetent cervix).   Having too much fluid in the amniotic sac (polyhydramnios).   Taking illegal drugs or smoking while pregnant.   Not gaining enough weight while pregnant.   Being younger than 65 and older than 26 years old.   Having a low socioeconomic status.   Being African American. SYMPTOMS Signs and symptoms of preterm labor include:   Menstrual-like cramps, abdominal pain, or back pain.  Uterine contractions that are regular, as frequent as six in an hour, regardless of their intensity (may be mild or painful).  Contractions that start on the top of the uterus and spread down to the lower abdomen and back.   A sense of increased pelvic pressure.   A watery or bloody mucus discharge that comes from the vagina.  TREATMENT Depending on the length of the pregnancy and other circumstances, your health care provider may suggest bed rest. If necessary, there are medicines that can be given to stop contractions and to mature the fetal lungs. If labor happens  before 34 weeks of pregnancy, a prolonged hospital stay may be recommended. Treatment depends on the condition of both you and the fetus.  WHAT SHOULD YOU DO IF YOU THINK YOU ARE IN PRETERM LABOR? Call your health care provider right away. You will need to go to the hospital to get checked immediately. HOW CAN YOU PREVENT PRETERM LABOR IN FUTURE PREGNANCIES? You should:   Stop smoking if you smoke.  Maintain healthy weight gain and avoid chemicals and drugs that are not necessary.  Be watchful for any type of infection.  Inform your health care provider if you have a known history of preterm labor.   This information is not intended to replace advice given to you by your health care provider. Make sure you discuss any questions you have with your health care provider.   Document Released: 06/12/2003 Document Revised: 11/22/2012 Document Reviewed: 04/24/2012 Elsevier Interactive Patient Education [employer]

## 2015-01-11 NOTE — [facility] Provider Note (Signed)
History     CSN: [id number]  Arrival date and time: 01/11/15 0347   None     No chief complaint on file.  HPI   Cathy Woodard is a 24 y.o. female [phone] at [redacted]w[redacted]d presenting to [facility] with contractions. The patient arrived to [facility] via EMS. The patient normally has contractions everyday, yesterday evening she feels that her contractions were coming more frequent and were more painful.   She rates rates her pain 6/10 when a contractions comes.  + fetal movements Denies vaginal bleeding. Denies leaking of fluid.  No intercourse in the last 24 hours   Plans a cesarean section with this pregnancy.   OB History    Gravida Para Term Preterm AB TAB SAB Ectopic Multiple Living   0 2 0 1 1 0 1      Past Medical History  Diagnosis Date  . Hypercholesteremia   . No pertinent past medical history   . Allergy     Past Surgical History  Procedure Laterality Date  . Cesarean section      Family History  Problem Relation Age of Onset  . Diabetes Maternal Grandmother   . Anesthesia problems Neg Hx   . Hypotension Neg Hx   . Malignant hyperthermia Neg Hx   . Pseudochol deficiency Neg Hx   . Bone cancer Maternal Grandmother 43  . Cancer Maternal Uncle     not sure  . Cancer Maternal Grandfather     not sure  . Diabetes Maternal Grandmother     Social History  Substance Use Topics  . Smoking status: Never Smoker   . Smokeless tobacco: Never Used  . Alcohol Use: 1.8 oz/week    3 Standard drinks or equivalent per week     Comment: Occas    Allergies:  Allergies  Allergen Reactions  . Anaprox [Naproxen Sodium] Rash  . Benadryl [Diphenhydramine Hcl] Rash    Facility-administered medications prior to admission  Medication Dose Route Frequency Provider Last Rate Last Dose  . ibuprofen (ADVIL,MOTRIN) tablet 600 mg  600 mg Oral Once Godfrey Pick, PA-C       Prescriptions prior to admission  Medication Sig Dispense Refill Last Dose  . Prenatal Vit-Fe  Fumarate-FA (PRENATAL MULTIVITAMIN) TABS tablet Take 1 tablet by mouth daily at 12 noon.   01/10/2015 at Unknown time  . fluticasone (FLONASE) 50 MCG/ACT nasal spray Place 2 sprays into the nose daily. 16 g 12 Taking  . ibuprofen (ADVIL,MOTRIN) 600 MG tablet Take 1 tablet (600 mg total) by mouth every 8 (eight) hours as needed for pain. 30 tablet 0 Taking   Results for orders placed or performed during the hospital encounter of 01/11/15 (from the past 48 hour(s))  Urinalysis, Routine w reflex microscopic (not at [facility])     Status: None   Collection Time: 01/11/15  3:50 AM  Result Value Ref Range   Color, Urine YELLOW YELLOW   APPearance CLEAR CLEAR   Specific Gravity, Urine 1.010 1.005 - 1.030   pH 5.5 5.0 - 8.0   Glucose, UA NEGATIVE NEGATIVE mg/dL   Hgb urine dipstick NEGATIVE NEGATIVE   Bilirubin Urine NEGATIVE NEGATIVE   Ketones, ur NEGATIVE NEGATIVE mg/dL   Protein, ur NEGATIVE NEGATIVE mg/dL   Urobilinogen, UA 0.2 0.0 - 1.0 mg/dL   Nitrite NEGATIVE NEGATIVE   Leukocytes, UA NEGATIVE NEGATIVE    Comment: MICROSCOPIC NOT DONE ON URINES WITH NEGATIVE PROTEIN, BLOOD, LEUKOCYTES, NITRITE, OR GLUCOSE <1000 mg/dL.  Review of Systems  Constitutional: Negative for fever and chills.  Gastrointestinal: Positive for abdominal pain. Negative for nausea and vomiting.   Physical Exam   Blood pressure 114/64, pulse 94, temperature 97.9 F (36.6 C), temperature source Oral, resp. rate 16, last menstrual period 05/17/2014.  Physical Exam  Constitutional: She appears well-developed and well-nourished. No distress.  HENT:  Head: Normocephalic.  Eyes: Pupils are equal, round, and reactive to light.  Neck: Neck supple.  Respiratory: Effort normal.  GI: Soft. There is no tenderness.  Genitourinary:  Cervix: closed, thick, posterior, soft.   Skin: She is not diaphoretic.   Fetal Tracing: Baseline: 120 bpm  Variability: Moderate  Accelerations: 15x15 Decelerations: none Toco: Irregular  pattern with UI   MAU Course  Procedures  None  MDM  0440: Discussed fetal tracing, HPI and cervical exam with Dr. Arelia Sneddon. He would like the patient to receive terb 0.25 along with offering PO hydration. Terb offered to the patient and the patient refused.  Patient refused tylenol   RN rechecked patient's cervix after 1.5 hours   Dilation: Closed Effacement (%): Thick Cervical Position: Posterior Station: Ballotable Presentation: Undeterminable Exam by:: Remigio Eisenmenger RN   Patient says she is ready to go home.   Assessment and Plan   A:  1. Braxton Hicks contractions    P:  Discharge home in stable condition Pelvic rest Keep your next OB appointment Return to MAU if symptoms worsen Kick counts  Duane Lope, NP 01/11/2015 10:54 AM

## 2015-02-13 ENCOUNTER — Encounter (HOSPITAL_COMMUNITY): Payer: Self-pay

## 2015-02-13 ENCOUNTER — Encounter (HOSPITAL_COMMUNITY)
Admission: RE | Admit: 2015-02-13 | Discharge: 2015-02-13 | Disposition: A | Discharge status: Home / Self Care | Payer: Commercial Managed Care - HMO | Source: Ambulatory Visit | Attending: Obstetrics and Gynecology | Admitting: Obstetrics and Gynecology

## 2015-02-13 LAB — TYPE AND SCREEN
ABO/RH(D): B POS
ANTIBODY SCREEN: NEGATIVE

## 2015-02-13 LAB — CBC
HEMATOCRIT: 33.7 % — AB (ref 36.0–46.0)
HEMOGLOBIN: 11.7 g/dL — AB (ref 12.0–15.0)
MCH: 29 pg (ref 26.0–34.0)
MCHC: 34.7 g/dL (ref 30.0–36.0)
MCV: 83.4 fL (ref 78.0–100.0)
Platelets: 129 10*3/uL — ABNORMAL LOW (ref 150–400)
RBC: 4.04 MIL/uL (ref 3.87–5.11)
RDW: 14.9 % (ref 11.5–15.5)
WBC: 7.9 10*3/uL (ref 4.0–10.5)

## 2015-02-13 LAB — RPR: RPR: NONREACTIVE

## 2015-02-13 MED ORDER — DEXTROSE 5 % IV SOLN
3.0000 g | INTRAVENOUS | Status: DC
  Filled 2015-02-13: qty 3000

## 2015-02-13 NOTE — Anesthesia Preprocedure Evaluation (Addendum)
Anesthesia Evaluation  Patient identified by MRN, date of birth, ID band Patient awake    Reviewed: Allergy & Precautions, NPO status , Patient's Chart, lab work & pertinent test results  History of Anesthesia Complications Negative for: history of anesthetic complications  Airway Mallampati: III  TM Distance: >3 FB Neck ROM: Full    Dental no notable dental hx. (+) Dental Advisory Given   Pulmonary neg pulmonary ROS,    Pulmonary exam normal breath sounds clear to auscultation       Cardiovascular negative cardio ROS Normal cardiovascular exam Rhythm:Regular Rate:Normal     Neuro/Psych negative neurological ROS  negative psych ROS   GI/Hepatic negative GI ROS, Neg liver ROS,   Endo/Other  obesity  Renal/GU negative Renal ROS  negative genitourinary   Musculoskeletal negative musculoskeletal ROS (+)   Abdominal   Peds negative pediatric ROS (+)  Hematology negative hematology ROS (+)   Anesthesia Other Findings   Reproductive/Obstetrics (+) Pregnancy                            Anesthesia Physical Anesthesia Plan  ASA: II  Anesthesia Plan: Spinal   Post-op Pain Management:    Induction: Intravenous  Airway Management Planned:   Additional Equipment:   Intra-op Plan:   Post-operative Plan:   Informed Consent: I have reviewed the patients History and Physical, chart, labs and discussed the procedure including the risks, benefits and alternatives for the proposed anesthesia with the patient or authorized representative who has indicated his/her understanding and acceptance.   Dental advisory given  Plan Discussed with: CRNA  Anesthesia Plan Comments:         Anesthesia Quick Evaluation

## 2015-02-13 NOTE — H&P (Signed)
Cathy Woodard is a 24 y.o. female presenting for repeat cesarean section. History OB History    Gravida Para Term Preterm AB TAB SAB Ectopic Multiple Living   4 1 1  0 2 0 1 1 0 1     Past Medical History  Diagnosis Date  . Hypercholesteremia   . No pertinent past medical history   . Allergy    Past Surgical History  Procedure Laterality Date  . Cesarean section     Family History: family history includes Bone cancer (age of onset: 2378) in her maternal grandmother; Cancer in her maternal grandfather and maternal uncle; Diabetes in her maternal grandmother and maternal grandmother. There is no history of Anesthesia problems, Hypotension, Malignant hyperthermia, or Pseudochol deficiency. Social History:  reports that she has never smoked. She has never used smokeless tobacco. She reports that she drinks about 1.8 oz of alcohol per week. She reports that she does not use illicit drugs.   Prenatal Transfer Tool  Maternal Diabetes: No Genetic Screening: Normal Maternal Ultrasounds/Referrals: Normal Fetal Ultrasounds or other Referrals:  None Maternal Substance Abuse:  No Significant Maternal Medications:  None Significant Maternal Lab Results:  Lab values include: Other: Sickle Cell Trait Other Comments:  None  Review of Systems  Eyes: Negative for blurred vision.  Gastrointestinal: Negative for abdominal pain.  Neurological: Negative for headaches.      Last menstrual period 05/17/2014. Exam Physical Exam  Cardiovascular: Normal rate.   Respiratory: Effort normal.  GI: Soft.    Prenatal labs: ABO, Rh: --/--/B POS (11/10 0845) Antibody: NEG (11/10 0845) Rubella:   RPR: Non Reactive (11/10 0845)  HBsAg:    HIV:    GBS:     Assessment/Plan: 24 yo G4P1 @ 39 0/7 weeks for repeat cesarean section   Cathy Woodard,Cathy Woodard 02/13/2015, 5:58 PM

## 2015-02-13 NOTE — Patient Instructions (Addendum)
   Your procedure is scheduled on: NOV 11 (FRIDAY)  [facility] at: 6[facility]  Pick up the phone at the desk and dial [phone] and inform us of your arrival.  Please call this number if you have any problems the morning of surgery: [phone]  DO NOT EAT OR DRINK AFTER MIDNIGHT TONIGHT (THURSDAY)  Do not wear jewelry, make-up, or FINGER nail polish No metal in your hair or on your body. Do not wear lotions, powders, perfumes.  You may wear deodorant.  Do not bring valuables to the hospital. Contacts, dentures or bridgework may not be worn into surgery.  Leave suitcase in the car. After Surgery it may be brought to your room. For patients being admitted to the hospital, checkout time is 11:00am the day of discharge.

## 2015-02-14 ENCOUNTER — Inpatient Hospital Stay (HOSPITAL_COMMUNITY): Payer: Commercial Managed Care - HMO | Admitting: Anesthesiology

## 2015-02-14 ENCOUNTER — Encounter (HOSPITAL_COMMUNITY): Payer: Self-pay | Admitting: Certified Registered Nurse Anesthetist

## 2015-02-14 ENCOUNTER — Inpatient Hospital Stay (HOSPITAL_COMMUNITY)
Admission: RE | Admit: 2015-02-14 | Discharge: 2015-02-16 | DRG: 766 | Disposition: A | Discharge status: Home / Self Care | Payer: Commercial Managed Care - HMO | Source: Ambulatory Visit | Attending: Obstetrics and Gynecology | Admitting: Obstetrics and Gynecology

## 2015-02-14 ENCOUNTER — Encounter (HOSPITAL_COMMUNITY)
Admission: RE | Disposition: A | Discharge status: Home / Self Care | Payer: Self-pay | Source: Ambulatory Visit | Attending: Obstetrics and Gynecology

## 2015-02-14 DIAGNOSIS — O34211 Maternal care for low transverse scar from previous cesarean delivery: Secondary | ICD-10-CM | POA: Diagnosis present

## 2015-02-14 DIAGNOSIS — Z6838 Body mass index (BMI) 38.0-38.9, adult: Secondary | ICD-10-CM

## 2015-02-14 DIAGNOSIS — Z833 Family history of diabetes mellitus: Secondary | ICD-10-CM

## 2015-02-14 DIAGNOSIS — Z3A39 39 weeks gestation of pregnancy: Secondary | ICD-10-CM

## 2015-02-14 DIAGNOSIS — E669 Obesity, unspecified: Secondary | ICD-10-CM | POA: Diagnosis present

## 2015-02-14 DIAGNOSIS — Z98891 History of uterine scar from previous surgery: Secondary | ICD-10-CM

## 2015-02-14 DIAGNOSIS — Z809 Family history of malignant neoplasm, unspecified: Secondary | ICD-10-CM

## 2015-02-14 DIAGNOSIS — O99214 Obesity complicating childbirth: Secondary | ICD-10-CM | POA: Diagnosis present

## 2015-02-14 LAB — PLATELET COUNT: Platelets: 136 10*3/uL — ABNORMAL LOW (ref 150–400)

## 2015-02-14 SURGERY — Surgical Case
Anesthesia: Spinal

## 2015-02-14 MED ORDER — SCOPOLAMINE 1 MG/3DAYS TD PT72
MEDICATED_PATCH | TRANSDERMAL | Status: AC
  Administered 2015-02-14: 1.5 mg via TRANSDERMAL
  Filled 2015-02-14: qty 1

## 2015-02-14 MED ORDER — NALBUPHINE HCL 10 MG/ML IJ SOLN: 5.0000 mg | Freq: Once | INTRAMUSCULAR | Status: DC | PRN

## 2015-02-14 MED ORDER — SODIUM CHLORIDE 0.9 % IJ SOLN: 3.0000 mL | INTRAMUSCULAR | Status: DC | PRN

## 2015-02-14 MED ORDER — OXYTOCIN 10 UNIT/ML IJ SOLN
INTRAMUSCULAR | Status: AC
  Filled 2015-02-14: qty 4

## 2015-02-14 MED ORDER — MEPERIDINE HCL 25 MG/ML IJ SOLN: 6.2500 mg | INTRAMUSCULAR | Status: DC | PRN

## 2015-02-14 MED ORDER — ZOLPIDEM TARTRATE 5 MG PO TABS: 5.0000 mg | ORAL_TABLET | Freq: Every evening | ORAL | Status: DC | PRN

## 2015-02-14 MED ORDER — OXYCODONE-ACETAMINOPHEN 5-325 MG PO TABS
2.0000 | ORAL_TABLET | ORAL | Status: DC | PRN
  Administered 2015-02-15 (×2): 2 via ORAL
  Filled 2015-02-14 (×2): qty 2

## 2015-02-14 MED ORDER — NALBUPHINE HCL 10 MG/ML IJ SOLN: 5.0000 mg | INTRAMUSCULAR | Status: DC | PRN

## 2015-02-14 MED ORDER — ACETAMINOPHEN 325 MG PO TABS
650.0000 mg | ORAL_TABLET | ORAL | Status: DC | PRN
  Administered 2015-02-16: 650 mg via ORAL
  Filled 2015-02-14: qty 2

## 2015-02-14 MED ORDER — NALOXONE HCL 2 MG/2ML IJ SOSY
1.0000 ug/kg/h | PREFILLED_SYRINGE | INTRAVENOUS | Status: DC | PRN
  Filled 2015-02-14: qty 2

## 2015-02-14 MED ORDER — FLUTICASONE PROPIONATE 50 MCG/ACT NA SUSP
2.0000 | Freq: Every day | NASAL | Status: DC
  Filled 2015-02-14: qty 16

## 2015-02-14 MED ORDER — OXYCODONE-ACETAMINOPHEN 5-325 MG PO TABS
1.0000 | ORAL_TABLET | ORAL | Status: DC | PRN
  Administered 2015-02-15 (×2): 1 via ORAL
  Filled 2015-02-14 (×2): qty 1

## 2015-02-14 MED ORDER — PHENYLEPHRINE 8 MG IN D5W 100 ML (0.08MG/ML) PREMIX OPTIME
INJECTION | INTRAVENOUS | Status: DC | PRN
  Administered 2015-02-14: 60 ug/min via INTRAVENOUS

## 2015-02-14 MED ORDER — ONDANSETRON HCL 4 MG/2ML IJ SOLN: 4.0000 mg | Freq: Three times a day (TID) | INTRAMUSCULAR | Status: DC | PRN

## 2015-02-14 MED ORDER — SIMETHICONE 80 MG PO CHEW: 80.0000 mg | CHEWABLE_TABLET | ORAL | Status: DC | PRN

## 2015-02-14 MED ORDER — OXYTOCIN 40 UNITS IN LACTATED RINGERS INFUSION - SIMPLE MED: 62.5000 mL/h | INTRAVENOUS | Status: AC

## 2015-02-14 MED ORDER — MORPHINE SULFATE (PF) 0.5 MG/ML IJ SOLN
INTRAMUSCULAR | Status: DC | PRN
  Administered 2015-02-14: .2 mg via INTRATHECAL

## 2015-02-14 MED ORDER — LANOLIN HYDROUS EX OINT: 1.0000 "application " | TOPICAL_OINTMENT | CUTANEOUS | Status: DC | PRN

## 2015-02-14 MED ORDER — BUPIVACAINE IN DEXTROSE 0.75-8.25 % IT SOLN
INTRATHECAL | Status: DC | PRN
  Administered 2015-02-14: 1.6 mL via INTRATHECAL

## 2015-02-14 MED ORDER — PROMETHAZINE HCL 25 MG/ML IJ SOLN
INTRAMUSCULAR | Status: DC | PRN
  Administered 2015-02-14: 6.25 mg via INTRAVENOUS

## 2015-02-14 MED ORDER — SIMETHICONE 80 MG PO CHEW
80.0000 mg | CHEWABLE_TABLET | Freq: Three times a day (TID) | ORAL | Status: DC
  Administered 2015-02-14 – 2015-02-16 (×5): 80 mg via ORAL
  Filled 2015-02-14 (×5): qty 1

## 2015-02-14 MED ORDER — OXYTOCIN 10 UNIT/ML IJ SOLN
40.0000 [IU] | INTRAVENOUS | Status: DC | PRN
  Administered 2015-02-14: 40 [IU] via INTRAVENOUS

## 2015-02-14 MED ORDER — SENNOSIDES-DOCUSATE SODIUM 8.6-50 MG PO TABS
2.0000 | ORAL_TABLET | ORAL | Status: DC
  Administered 2015-02-14 – 2015-02-15 (×2): 2 via ORAL
  Filled 2015-02-14 (×2): qty 2

## 2015-02-14 MED ORDER — WITCH HAZEL-GLYCERIN EX PADS
1.0000 | MEDICATED_PAD | CUTANEOUS | Status: DC | PRN
Start: 2015-02-14 — End: 2015-02-16

## 2015-02-14 MED ORDER — CEFAZOLIN SODIUM-DEXTROSE 2-3 GM-% IV SOLR
INTRAVENOUS | Status: AC
  Filled 2015-02-14: qty 50

## 2015-02-14 MED ORDER — PRENATAL MULTIVITAMIN CH
1.0000 | ORAL_TABLET | Freq: Every day | ORAL | Status: DC
  Administered 2015-02-15 – 2015-02-16 (×2): 1 via ORAL
  Filled 2015-02-14 (×2): qty 1

## 2015-02-14 MED ORDER — LACTATED RINGERS IV SOLN
Freq: Once | INTRAVENOUS | Status: AC
  Administered 2015-02-14: 06:00:00 via INTRAVENOUS

## 2015-02-14 MED ORDER — PHENYLEPHRINE 8 MG IN D5W 100 ML (0.08MG/ML) PREMIX OPTIME
INJECTION | INTRAVENOUS | Status: AC
  Filled 2015-02-14: qty 100

## 2015-02-14 MED ORDER — PHENYLEPHRINE HCL 10 MG/ML IJ SOLN
INTRAMUSCULAR | Status: DC | PRN
  Administered 2015-02-14: 80 ug via INTRAVENOUS

## 2015-02-14 MED ORDER — FENTANYL CITRATE (PF) 100 MCG/2ML IJ SOLN: 25.0000 ug | INTRAMUSCULAR | Status: DC | PRN

## 2015-02-14 MED ORDER — FENTANYL CITRATE (PF) 100 MCG/2ML IJ SOLN
INTRAMUSCULAR | Status: DC | PRN
  Administered 2015-02-14: 10 ug via INTRATHECAL

## 2015-02-14 MED ORDER — MORPHINE SULFATE (PF) 0.5 MG/ML IJ SOLN
INTRAMUSCULAR | Status: AC
  Filled 2015-02-14: qty 100

## 2015-02-14 MED ORDER — METOCLOPRAMIDE HCL 5 MG/ML IJ SOLN
INTRAMUSCULAR | Status: AC
  Filled 2015-02-14: qty 2

## 2015-02-14 MED ORDER — DEXAMETHASONE SODIUM PHOSPHATE 4 MG/ML IJ SOLN
INTRAMUSCULAR | Status: DC | PRN
  Administered 2015-02-14: 4 mg via INTRAVENOUS

## 2015-02-14 MED ORDER — CEFAZOLIN SODIUM-DEXTROSE 2-3 GM-% IV SOLR
2.0000 g | Freq: Once | INTRAVENOUS | Status: AC
  Administered 2015-02-14: 2 g via INTRAVENOUS

## 2015-02-14 MED ORDER — MENTHOL 3 MG MT LOZG
1.0000 | LOZENGE | OROMUCOSAL | Status: DC | PRN
  Administered 2015-02-16: 3 mg via ORAL
  Filled 2015-02-14: qty 9

## 2015-02-14 MED ORDER — METOCLOPRAMIDE HCL 5 MG/ML IJ SOLN
INTRAMUSCULAR | Status: DC | PRN
  Administered 2015-02-14: 10 mg via INTRAVENOUS

## 2015-02-14 MED ORDER — NALOXONE HCL 0.4 MG/ML IJ SOLN: 0.4000 mg | INTRAMUSCULAR | Status: DC | PRN

## 2015-02-14 MED ORDER — ONDANSETRON HCL 4 MG/2ML IJ SOLN
INTRAMUSCULAR | Status: AC
  Filled 2015-02-14: qty 2

## 2015-02-14 MED ORDER — DEXAMETHASONE SODIUM PHOSPHATE 4 MG/ML IJ SOLN
INTRAMUSCULAR | Status: AC
  Filled 2015-02-14: qty 1

## 2015-02-14 MED ORDER — PROMETHAZINE HCL 25 MG/ML IJ SOLN
INTRAMUSCULAR | Status: AC
  Filled 2015-02-14: qty 1

## 2015-02-14 MED ORDER — SODIUM CHLORIDE 0.9 % IR SOLN
Status: DC | PRN
  Administered 2015-02-14: 1000 mL

## 2015-02-14 MED ORDER — SCOPOLAMINE 1 MG/3DAYS TD PT72
1.0000 | MEDICATED_PATCH | Freq: Once | TRANSDERMAL | Status: DC
  Administered 2015-02-14: 1.5 mg via TRANSDERMAL

## 2015-02-14 MED ORDER — LACTATED RINGERS IV SOLN
INTRAVENOUS | Status: DC | PRN
  Administered 2015-02-14 (×2): via INTRAVENOUS

## 2015-02-14 MED ORDER — SCOPOLAMINE 1 MG/3DAYS TD PT72: 1.0000 | MEDICATED_PATCH | Freq: Once | TRANSDERMAL | Status: DC

## 2015-02-14 MED ORDER — ONDANSETRON HCL 4 MG/2ML IJ SOLN
INTRAMUSCULAR | Status: DC | PRN
  Administered 2015-02-14: 4 mg via INTRAVENOUS

## 2015-02-14 MED ORDER — LACTATED RINGERS IV SOLN
INTRAVENOUS | Status: DC
  Administered 2015-02-14: 20:00:00 via INTRAVENOUS

## 2015-02-14 MED ORDER — TETANUS-DIPHTH-ACELL PERTUSSIS 5-2.5-18.5 LF-MCG/0.5 IM SUSP: 0.5000 mL | Freq: Once | INTRAMUSCULAR | Status: DC

## 2015-02-14 MED ORDER — DIBUCAINE 1 % RE OINT: 1.0000 "application " | TOPICAL_OINTMENT | RECTAL | Status: DC | PRN

## 2015-02-14 MED ORDER — LACTATED RINGERS IV SOLN
INTRAVENOUS | Status: DC | PRN
  Administered 2015-02-14: 08:00:00 via INTRAVENOUS

## 2015-02-14 MED ORDER — ONDANSETRON HCL 4 MG/2ML IJ SOLN: 4.0000 mg | Freq: Once | INTRAMUSCULAR | Status: DC | PRN

## 2015-02-14 MED ORDER — FENTANYL CITRATE (PF) 100 MCG/2ML IJ SOLN
INTRAMUSCULAR | Status: AC
  Filled 2015-02-14: qty 4

## 2015-02-14 MED ORDER — SIMETHICONE 80 MG PO CHEW
80.0000 mg | CHEWABLE_TABLET | ORAL | Status: DC
  Administered 2015-02-14 – 2015-02-15 (×2): 80 mg via ORAL
  Filled 2015-02-14 (×2): qty 1

## 2015-02-14 SURGICAL SUPPLY — 38 items
APL SKNCLS STERI-STRIP NONHPOA (GAUZE/BANDAGES/DRESSINGS) ×1
BENZOIN TINCTURE PRP APPL 2/3 (GAUZE/BANDAGES/DRESSINGS) ×2 IMPLANT
CLAMP CORD UMBIL (MISCELLANEOUS) IMPLANT
CLOSURE WOUND 1/2 X4 (GAUZE/BANDAGES/DRESSINGS) ×1
CLOTH BEACON ORANGE TIMEOUT ST (SAFETY) ×3 IMPLANT
CONTAINER PREFILL 10% NBF 15ML (MISCELLANEOUS) IMPLANT
DRAPE SHEET LG 3/4 BI-LAMINATE (DRAPES) IMPLANT
DRSG OPSITE POSTOP 4X10 (GAUZE/BANDAGES/DRESSINGS) ×3 IMPLANT
DURAPREP 26ML APPLICATOR (WOUND CARE) ×3 IMPLANT
ELECT REM PT RETURN 9FT ADLT (ELECTROSURGICAL) ×3
ELECTRODE REM PT RTRN 9FT ADLT (ELECTROSURGICAL) ×1 IMPLANT
EXTRACTOR VACUUM M CUP 4 TUBE (SUCTIONS) IMPLANT
EXTRACTOR VACUUM M CUP 4' TUBE (SUCTIONS)
GLOVE BIO SURGEON STRL SZ8 (GLOVE) ×3 IMPLANT
GLOVE BIOGEL PI IND STRL 7.0 (GLOVE) ×1 IMPLANT
GLOVE BIOGEL PI INDICATOR 7.0 (GLOVE) ×2
GOWN STRL REUS W/TWL LRG LVL3 (GOWN DISPOSABLE) ×6 IMPLANT
KIT ABG SYR 3ML LUER SLIP (SYRINGE) ×3 IMPLANT
LIQUID BAND (GAUZE/BANDAGES/DRESSINGS) IMPLANT
NDL HYPO 25X5/8 SAFETYGLIDE (NEEDLE) ×1 IMPLANT
NEEDLE HYPO 25X5/8 SAFETYGLIDE (NEEDLE) ×3 IMPLANT
NS IRRIG 1000ML POUR BTL (IV SOLUTION) ×3 IMPLANT
PACK C SECTION WH (CUSTOM PROCEDURE TRAY) ×3 IMPLANT
PAD OB MATERNITY 4.3X12.25 (PERSONAL CARE ITEMS) ×3 IMPLANT
PENCIL SMOKE EVAC W/HOLSTER (ELECTROSURGICAL) ×3 IMPLANT
STRIP CLOSURE SKIN 1/2X4 (GAUZE/BANDAGES/DRESSINGS) ×1 IMPLANT
SUT CHROMIC 2 0 SH (SUTURE) ×2 IMPLANT
SUT MNCRL 0 VIOLET CTX 36 (SUTURE) ×4 IMPLANT
SUT MONOCRYL 0 CTX 36 (SUTURE) ×8
SUT PDS AB 0 CTX 60 (SUTURE) ×3 IMPLANT
SUT PLAIN 0 NONE (SUTURE) IMPLANT
SUT PLAIN 2 0 (SUTURE) ×3
SUT PLAIN 2 0 XLH (SUTURE) IMPLANT
SUT PLAIN ABS 2-0 CT1 27XMFL (SUTURE) IMPLANT
SUT VIC AB 4-0 KS 27 (SUTURE) ×3 IMPLANT
SYR 30ML LL (SYRINGE) ×3 IMPLANT
TOWEL OR 17X24 6PK STRL BLUE (TOWEL DISPOSABLE) ×3 IMPLANT
TRAY FOLEY CATH SILVER 14FR (SET/KITS/TRAYS/PACK) ×3 IMPLANT

## 2015-02-14 NOTE — Anesthesia Postprocedure Evaluation (Signed)
  Anesthesia Post-op Note  Patient: Cathy Woodard  Procedure(s) Performed: Procedure(s) (LRB): CESAREAN SECTION (N/A)  Patient Location: PACU  Anesthesia Type: Spinal  Level of Consciousness: awake and alert   Airway and Oxygen Therapy: Patient Spontanous Breathing  Post-op Pain: mild  Post-op Assessment: Post-op Vital signs reviewed, Patient's Cardiovascular Status Stable, Respiratory Function Stable, Patent Airway and No signs of Nausea or vomiting  Last Vitals:  Filed Vitals:   02/14/15 1000  BP: 102/48  Pulse: 62  Temp:   Resp: 18    Post-op Vital Signs: stable   Complications: No apparent anesthesia complications

## 2015-02-14 NOTE — Progress Notes (Signed)
No changes to H&P per patient history Reviewed procedure-cesarean section Risks reviewed, all questions answered. Patient states she understands and agrees

## 2015-02-14 NOTE — Anesthesia Procedure Notes (Signed)
Spinal Patient location during procedure: OR Staffing Anesthesiologist: Matisse Roskelley Performed by: anesthesiologist  Preanesthetic Checklist Completed: patient identified, site marked, surgical consent, pre-op evaluation, timeout performed, IV checked, risks and benefits discussed and monitors and equipment checked Spinal Block Patient position: sitting Prep: DuraPrep Patient monitoring: continuous pulse ox, blood pressure and heart rate Approach: midline Injection technique: single-shot Needle Needle type: Sprotte  Needle gauge: 24 G Needle length: 9 cm Additional Notes Functioning IV was confirmed and monitors were applied. Sterile prep and drape, including hand hygiene, mask and sterile gloves were used. The patient was positioned and the spine was prepped. The skin was anesthetized with lidocaine.  Free flow of clear CSF was obtained prior to injecting local anesthetic into the CSF.  The spinal needle aspirated freely following injection.  The needle was carefully withdrawn.  The patient tolerated the procedure well. Consent was obtained prior to procedure with all questions answered and concerns addressed. Risks including but not limited to bleeding, infection, nerve damage, paralysis, failed block, inadequate analgesia, allergic reaction, high spinal, itching and headache were discussed and the patient wished to proceed.   Elanore Talcott, MD     

## 2015-02-14 NOTE — Anesthesia Postprocedure Evaluation (Signed)
  Anesthesia Post-op Note  Patient: Cathy Woodard  Procedure(s) Performed: Procedure(s) with comments: CESAREAN SECTION (N/A) - edc 02/21/15   Patient Location: Mother/Baby  Anesthesia Type:Spinal  Level of Consciousness: awake, alert , oriented and patient cooperative  Airway and Oxygen Therapy: Patient Spontanous Breathing  Post-op Pain: none  Post-op Assessment: Post-op Vital signs reviewed, Patient's Cardiovascular Status Stable, Respiratory Function Stable, Patent Airway, No headache, No backache and Patient able to bend at knees              Post-op Vital Signs: Reviewed and stable  Last Vitals:  Filed Vitals:   02/14/15 1245  BP: 106/54  Pulse: 61  Temp: 37.3 C  Resp: 17    Complications: No apparent anesthesia complications

## 2015-02-14 NOTE — Transfer of Care (Signed)
Immediate Anesthesia Transfer of Care Note  Patient: Cathy Woodard  Procedure(s) Performed: Procedure(s) with comments: CESAREAN SECTION (N/A) - edc 02/21/15   Patient Location: PACU  Anesthesia Type:Spinal  Level of Consciousness: awake, alert , oriented and patient cooperative  Airway & Oxygen Therapy: Patient Spontanous Breathing  Post-op Assessment: Report given to RN and Post -op Vital signs reviewed and stable  Post vital signs: Reviewed and stable  Last Vitals:  Filed Vitals:   02/14/15 0553  BP: 111/65  Pulse: 81  Temp: 36.6 C  Resp: 20    Complications: No apparent anesthesia complications

## 2015-02-14 NOTE — Lactation Note (Addendum)
This note was copied from the chart of Cathy Woodard. Lactation Consultation Note  Patient Name: Cathy Woodard Today's Date: 02/14/2015 Reason for consult: Initial assessment  Initial Consult with 2nd time mom. Mom reports she BF her 89 yo for 3 days and lost patience. She wants to BF this baby. Infant currently asleep sts with mom. Mom reports that her first feeding with him went better than she thought it would. Infant with 1 BF for 20 minutes with latch score of 6. Infant with 1 void and 0 stools since birth. He was born at [redacted] weeks GA weighing 7lb 2.4 oz. Mom does not have any questions at this time. Enc mom to feed 8-12 x in 24 hours at first feeding cues. Discussed Supply and Demand, NL NB feeding behavior including cluster feeding, coming to volume of milk, stomach size, NB Nutritional needs, I/O. LC Brochure given, advised of BF Support Groups, OP Services, Phone # and BF Resources. [facility] and to call [facility] on Monday to make appointment. and has a [employer] PIS DEBP from [employer] for home use. Enc mom to call with questions/concerns or for feeding assistance.   Maternal Data Formula Feeding for Exclusion: No Does the patient have breastfeeding experience prior to this delivery?: Yes  Feeding Feeding Type: Breast Fed  LATCH Score/Interventions                      Lactation Tools Discussed/Used WIC Program: Yes   Consult Status Consult Status: Follow-up Date: 02/08/15 Follow-up type: In-patient    Silas FloodSharon S Fareed Fung 02/14/2015, 3:18 PM

## 2015-02-14 NOTE — Op Note (Signed)
[name]:  Speigner, Thersea             ACCOUNT NO.:  [id number]  MEDICAL RECORD NO.:  [id number]  LOCATION:  9133                          FACILITY:  [facility]  PHYSICIAN:  Guy SandiferJames E. Woodard Cathy Woodard, M.D. DATE OF BIRTH:  1990-11-08  DATE OF PROCEDURE:  02/14/2015 DATE OF DISCHARGE:                              OPERATIVE REPORT   PREOPERATIVE DIAGNOSES: 1. Intrauterine pregnancy at 4539 and 0/7th weeks. 2. Previous cesarean section, desires repeat.  POSTOPERATIVE DIAGNOSES: 1. Intrauterine pregnancy at 4639 and 0/7th weeks. 2. Previous cesarean section, desires repeat.  PROCEDURE:  Repeat low transverse cesarean section.  SURGEON:  Guy SandiferJames E. Woodard Cathy Woodard, M.D.  ANESTHESIA:  Spinal.  ESTIMATED BLOOD LOSS:  600 mL.  FINDINGS:  Viable female infant, Apgars of 9 and 9 at 1 and 5 minutes respectively.  Birth weight, arterial cord pH pending.  SPECIMENS:  Placenta to Pathology.  INDICATIONS AND CONSENT:  The patient is a 24 year old, African American female, G4, P1, at 9239 and 0/7th weeks.  She has a previous cesarean section and desires a repeat.  Potential risks and complications were reviewed preoperatively including, but not limited to, infection, organ damage, bleeding requiring transfusion of blood products with HIV and hepatitis acquisition, DVT, PE, pneumonia.  All questions were answered. The patient states she understands and agrees.  Consent was signed on the chart.  DESCRIPTION OF PROCEDURE:  The patient was taken to the operating room where she was identified.  Spinal anesthetic was placed per anesthesiology, and she was placed in a dorsal supine position with a 15- degree left lateral wedge.  Foley catheter was placed.  She was pre[facility] and [facility].  Time-out was undertaken.  She was tested for adequate spinal anesthesia.  Skin was entered through the previous Pfannenstiel scar and dissection was carried out in layers to the peritoneum.  Peritoneum was incised  extended superiorly and inferiorly.  Vesicouterine peritoneum was taken down cephalad laterally. Uterus was incised in a low transverse manner with a scalpel, and the uterus was entered bluntly with a hemostat.  Clear fluid was noted.  The uterine incision was extended manually cephalad laterally.  Baby was then delivered through the incision with a vacuum extractor with 1 gentle pull with no pop offs pull.  Remainder of the baby was then delivered without difficulty, good cry and tone was noted.  Cord was clamped and cut, and the baby was handed to awaiting pediatrics team. Placenta was manually delivered and sent to Pathology.  Uterus was clean.  Uterus was closed in 2 running locking imbricating layers of 0 Monocryl suture.  In order to obtain complete hemostasis at the right angle, the uterus was delivered.  With care taken to protect the posterior side of the broad ligament, 2-0 chromic suture was used to obtain complete hemostasis at the right ankle.  Uterus was returned to the abdomen.  Careful inspection revealed good hemostasis.  Anterior peritoneum was then closed in a running fashion with 0 Monocryl suture which was also used to reapproximate the pyramidalis muscle in midline. Anterior rectus fascia was closed in running fashion with 0 looped PDS suture.  Subcutaneous layers were closed with interrupted plain and the skin was  closed in a subcuticular fashion with Vicryl on a Keith needle. Benzoin and Steri-Strips were applied, honeycomb was applied.  All counts were correct.  The patient was taken to the recovery room in stable condition.     Guy Sandifer Woodard Cloud, M.D.     JET/MEDQ  D:  02/14/2015  T:  02/14/2015  Job:  [id number]

## 2015-02-14 NOTE — Addendum Note (Signed)
Addendum  created 02/14/15 1320 by Yolonda KidaAlison L Adriona Kaney, CRNA   Modules edited: Notes Section   Notes Section:  File: [id number]

## 2015-02-14 NOTE — Brief Op Note (Signed)
02/14/2015  8:28 AM  PATIENT:  Cathy Woodard  24 y.o. female  PRE-OPERATIVE DIAGNOSIS:  previous X1  POST-OPERATIVE DIAGNOSIS:  previous X1  PROCEDURE:  Procedure(s) with comments: CESAREAN SECTION (N/A) - edc 02/21/15   SURGEON:  Surgeon(s) and Role:    * Harold HedgeJames Jevaughn Degollado, MD - Primary  PHYSICIAN ASSISTANT:   ASSISTANTS: none   ANESTHESIA:   spinal  EBL:  Total I/O In: 2200 [I.V.:2200] Out: 1200 [Urine:400; Blood:800]  BLOOD ADMINISTERED:none  DRAINS: Urinary Catheter (Foley)   LOCAL MEDICATIONS USED:  NONE  SPECIMEN:  Source of Specimen:  placenta  DISPOSITION OF SPECIMEN:  PATHOLOGY  COUNTS:  YES  TOURNIQUET:  * No tourniquets in log *  DICTATION: .Other Dictation: Dictation Number [id number]  PLAN OF CARE: Admit to inpatient   PATIENT DISPOSITION:  PACU - hemodynamically stable.   Delay start of Pharmacological VTE agent (>24hrs) due to surgical blood loss or risk of bleeding: not applicable

## 2015-02-15 LAB — CBC
HEMATOCRIT: 31.8 % — AB (ref 36.0–46.0)
HEMOGLOBIN: 11.2 g/dL — AB (ref 12.0–15.0)
MCH: 28.9 pg (ref 26.0–34.0)
MCHC: 35.2 g/dL (ref 30.0–36.0)
MCV: 82.2 fL (ref 78.0–100.0)
Platelets: 119 10*3/uL — ABNORMAL LOW (ref 150–400)
RBC: 3.87 MIL/uL (ref 3.87–5.11)
RDW: 14.8 % (ref 11.5–15.5)
WBC: 13.4 10*3/uL — ABNORMAL HIGH (ref 4.0–10.5)

## 2015-02-15 NOTE — Progress Notes (Signed)
Subjective: Postpartum Day 1: Cesarean Delivery Patient reports tolerating PO, + flatus and no problems voiding.    Objective: Vital signs in last 24 hours: Temp:  [97.6 F (36.4 C)-99.3 F (37.4 C)] 98.6 F (37 C) (11/12 0607) Pulse Rate:  [61-87] 70 (11/12 0607) Resp:  [14-21] 18 (11/12 0607) BP: (94-114)/(48-73) 99/62 mmHg (11/12 0607) SpO2:  [95 %-100 %] 96 % (11/12 0607)  Physical Exam:  General: alert, cooperative and no distress Lochia: appropriate Uterine Fundus: firm Incision: healing well DVT Evaluation: No evidence of DVT seen on physical exam.   Recent Labs  02/13/15 0845 02/15/15 0530  HGB 11.7* 11.2*  HCT 33.7* 31.8*    Assessment/Plan: Status post Cesarean section. Doing well postoperatively.  Continue current care.  Torrey Horseman II,Ember Henrikson E 02/15/2015, 8:40 AM

## 2015-02-16 ENCOUNTER — Encounter (HOSPITAL_COMMUNITY): Payer: Self-pay | Admitting: Obstetrics and Gynecology

## 2015-02-16 LAB — CBC
HCT: 31.8 % — ABNORMAL LOW (ref 36.0–46.0)
HEMOGLOBIN: 11 g/dL — AB (ref 12.0–15.0)
MCH: 28.9 pg (ref 26.0–34.0)
MCHC: 34.6 g/dL (ref 30.0–36.0)
MCV: 83.7 fL (ref 78.0–100.0)
PLATELETS: 120 10*3/uL — AB (ref 150–400)
RBC: 3.8 MIL/uL — AB (ref 3.87–5.11)
RDW: 15 % (ref 11.5–15.5)
WBC: 10.3 10*3/uL (ref 4.0–10.5)

## 2015-02-16 LAB — RUBELLA SCREEN: RUBELLA: 2.79 {index} (ref >0.99)

## 2015-02-16 MED ORDER — IBUPROFEN 600 MG PO TABS
600.0000 mg | ORAL_TABLET | Freq: Four times a day (QID) | ORAL | Status: DC
  Administered 2015-02-16: 600 mg via ORAL
  Filled 2015-02-16: qty 1

## 2015-02-16 MED ORDER — MAGNESIUM HYDROXIDE 400 MG/5ML PO SUSP
30.0000 mL | Freq: Once | ORAL | Status: AC
  Administered 2015-02-16: 30 mL via ORAL
  Filled 2015-02-16: qty 30

## 2015-02-16 MED ORDER — OXYCODONE-ACETAMINOPHEN 5-325 MG PO TABS: 1.0000 | ORAL_TABLET | Freq: Four times a day (QID) | ORAL | Status: DC | PRN

## 2015-02-16 NOTE — Progress Notes (Signed)
Pt. Requesting ibuprofen for incisional pain. She states she would rather not take Percocet as it makes her drowsey. She states that she has taken ibuprofen before and did not show allergic reactions to it. Plan to call Dr. Henderson Cloudomblin to obtain an order for ibuprofen.

## 2015-02-16 NOTE — Discharge Summary (Signed)
Obstetric Discharge Summary Reason for Admission: cesarean section Prenatal Procedures: none Intrapartum Procedures: cesarean: low cervical, transverse Postpartum Procedures: none Complications-Operative and Postpartum: none HEMOGLOBIN  Date Value Ref Range Status  02/16/2015 11.0* 12.0 - 15.0 g/dL Final   HCT  Date Value Ref Range Status  02/16/2015 31.8* 36.0 - 46.0 % Final    Physical Exam:  General: alert, cooperative and no distress Lochia: appropriate Uterine Fundus: firm Incision: healing well DVT Evaluation: No evidence of DVT seen on physical exam.  Discharge Diagnoses: Term Pregnancy-delivered  Discharge Information: Date: 02/16/2015 Activity: pelvic rest Diet: routine Medications: PNV and Percocet Condition: stable Instructions: refer to practice specific booklet Discharge to: home   Newborn Data: Live born female  Birth Weight: 7 lb 2.5 oz (3245 g) APGAR: 9, 9  Home with mother.  Cathy Woodard II,Cathy Woodard E 02/16/2015, 7:42 AM

## 2015-02-16 NOTE — Progress Notes (Signed)
Tearful. Uncommunicative. When questioned patient complains of fatique and not being able to sleep in the hospital. Patient stating, " I just want to go home."

## 2015-02-16 NOTE — Progress Notes (Signed)
Subjective: Postpartum Day 2: Cesarean Delivery Patient reports tolerating PO, + flatus and no problems voiding.    Objective: Vital signs in last 24 hours: Temp:  [98.1 F (36.7 C)-98.4 F (36.9 C)] 98.1 F (36.7 C) (11/13 0600) Pulse Rate:  [68-97] 97 (11/13 0600) Resp:  [16-20] 20 (11/13 0600) BP: (100-119)/(60-72) 119/72 mmHg (11/13 0600) SpO2:  [98 %] 98 % (11/12 0830)  Physical Exam:  General: alert, cooperative and no distress Lochia: appropriate Uterine Fundus: firm Incision: healing well DVT Evaluation: No evidence of DVT seen on physical exam.   Recent Labs  02/15/15 0530 02/16/15 0532  HGB 11.2* 11.0*  HCT 31.8* 31.8*    Assessment/Plan: Status post Cesarean section. Doing well postoperatively.  Discharge home with standard precautions and return to clinic in 4-6 weeks.  Fedora Knisely II,Jeryn Cerney E 02/16/2015, 7:40 AM

## 2016-02-02 LAB — OB RESULTS CONSOLE RPR
RPR: NONREACTIVE
RPR: NONREACTIVE

## 2016-02-02 LAB — OB RESULTS CONSOLE ABO/RH
RH Type: NEGATIVE
RH Type: POSITIVE

## 2016-02-02 LAB — HIV-1 RNA, QUALITATIVE, TMA: HIV-1 RNA, Qualitative, TMA: NEGATIVE

## 2016-02-02 LAB — OB RESULTS CONSOLE HEPATITIS B SURFACE ANTIGEN
HEP B S AG: NEGATIVE
Hepatitis B Surface Ag: NEGATIVE

## 2016-02-02 LAB — OB RESULTS CONSOLE RUBELLA ANTIBODY, IGM
RUBELLA: IMMUNE
Rubella: IMMUNE

## 2016-02-02 LAB — OB RESULTS CONSOLE HIV ANTIBODY (ROUTINE TESTING)
HIV: NONREACTIVE
HIV: NONREACTIVE

## 2016-02-02 LAB — OB RESULTS CONSOLE GC/CHLAMYDIA
Chlamydia: NEGATIVE
Chlamydia: NEGATIVE
GC PROBE AMP, GENITAL: NEGATIVE
Gonorrhea: NEGATIVE

## 2016-02-02 LAB — OB RESULTS CONSOLE ANTIBODY SCREEN: ANTIBODY SCREEN: NEGATIVE

## 2016-03-27 ENCOUNTER — Inpatient Hospital Stay (HOSPITAL_COMMUNITY)
Admission: AD | Admit: 2016-03-27 | Discharge: 2016-03-27 | Disposition: A | Discharge status: Home / Self Care | Payer: Medicaid Other | Source: Ambulatory Visit | Attending: Obstetrics and Gynecology | Admitting: Obstetrics and Gynecology

## 2016-03-27 ENCOUNTER — Encounter (HOSPITAL_COMMUNITY): Payer: Self-pay | Admitting: *Deleted

## 2016-03-27 DIAGNOSIS — E7800 Pure hypercholesterolemia, unspecified: Secondary | ICD-10-CM | POA: Insufficient documentation

## 2016-03-27 DIAGNOSIS — N949 Unspecified condition associated with female genital organs and menstrual cycle: Secondary | ICD-10-CM

## 2016-03-27 DIAGNOSIS — O26892 Other specified pregnancy related conditions, second trimester: Secondary | ICD-10-CM | POA: Insufficient documentation

## 2016-03-27 DIAGNOSIS — O99282 Endocrine, nutritional and metabolic diseases complicating pregnancy, second trimester: Secondary | ICD-10-CM | POA: Insufficient documentation

## 2016-03-27 DIAGNOSIS — R109 Unspecified abdominal pain: Secondary | ICD-10-CM | POA: Diagnosis present

## 2016-03-27 DIAGNOSIS — R102 Pelvic and perineal pain: Secondary | ICD-10-CM | POA: Diagnosis not present

## 2016-03-27 DIAGNOSIS — Z3491 Encounter for supervision of normal pregnancy, unspecified, first trimester: Secondary | ICD-10-CM

## 2016-03-27 DIAGNOSIS — Z3A18 18 weeks gestation of pregnancy: Secondary | ICD-10-CM | POA: Diagnosis not present

## 2016-03-27 LAB — URINALYSIS, ROUTINE W REFLEX MICROSCOPIC
BILIRUBIN URINE: NEGATIVE
Glucose, UA: NEGATIVE mg/dL
Hgb urine dipstick: NEGATIVE
KETONES UR: NEGATIVE mg/dL
LEUKOCYTES UA: NEGATIVE
NITRITE: NEGATIVE
PROTEIN: NEGATIVE mg/dL
Specific Gravity, Urine: 1.012 (ref 1.005–1.030)
pH: 7 (ref 5.0–8.0)

## 2016-03-27 LAB — POCT PREGNANCY, URINE: Preg Test, Ur: POSITIVE — AB

## 2016-03-27 NOTE — MAU Note (Signed)
Pt states she is having lower abdominal pain that has been going on for several weeks.  Pt states she felt movement sooner with her previous pregnancies but is not really feeling movement with this pregnancy.

## 2016-03-27 NOTE — Discharge Instructions (Signed)
Round Ligament Pain Introduction The round ligament is a cord of muscle and tissue that helps to support the uterus. It can become a source of pain during pregnancy if it becomes stretched or twisted as the baby grows. The pain usually begins in the second trimester of pregnancy, and it can come and go until the baby is delivered. It is not a serious problem, and it does not cause harm to the baby. Round ligament pain is usually a short, sharp, and pinching pain, but it can also be a dull, lingering, and aching pain. The pain is felt in the lower side of the abdomen or in the groin. It usually starts deep in the groin and moves up to the outside of the hip area. Pain can occur with:  A sudden change in position.  Rolling over in bed.  Coughing or sneezing.  Physical activity. Follow these instructions at home: Watch your condition for any changes. Take these steps to help with your pain:  When the pain starts, relax. Then try:  Sitting down.  Flexing your knees up to your abdomen.  Lying on your side with one pillow under your abdomen and another pillow between your legs.  Sitting in a warm bath for 15-20 minutes or until the pain goes away.  Take over-the-counter and prescription medicines only as told by your health care provider.  Move slowly when you sit and stand.  Avoid long walks if they cause pain.  Stop or lessen your physical activities if they cause pain. Contact a health care provider if:  Your pain does not go away with treatment.  You feel pain in your back that you did not have before.  Your medicine is not helping. Get help right away if:  You develop a fever or chills.  You develop uterine contractions.  You develop vaginal bleeding.  You develop nausea or vomiting.  You develop diarrhea.  You have pain when you urinate. This information is not intended to replace advice given to you by your health care provider. Make sure you discuss any questions  you have with your health care provider. Document Released: 12/30/2007 Document Revised: 08/28/2015 Document Reviewed: 05/29/2014  2017 [employer]  

## 2016-03-27 NOTE — MAU Provider Note (Signed)
History     CSN: [id number]  Arrival date and time: 03/27/16 [id number]   First Provider Initiated Contact with Patient 03/27/16 [id number]      Chief Complaint  Patient presents with  . Abdominal Pain   HPI    [name] is a 25 y.o. female [phone] @ 3467w6d here with abdominal pain. "it's not really pain, its more pressure and irritation". The pain worsens when she is walking. She tried to go shopping yesterday at the mall and felt a lot of pressure while she was walking. She is also concerned about the baby movements. "I am not feeling the baby move".   Next appointment in the office is Jan 3rd  OB History    Gravida Para Term Preterm AB Living   5 2 2  0 2 2   SAB TAB Ectopic Multiple Live Births   1 0 1 0 2      Past Medical History:  Diagnosis Date  . Allergy   . Hypercholesteremia   . No pertinent past medical history     Past Surgical History:  Procedure Laterality Date  . CESAREAN SECTION    . CESAREAN SECTION N/A 02/14/2015   Procedure: CESAREAN SECTION;  Surgeon: Harold HedgeJames Tomblin, MD;  Location: [facility];  Service: Obstetrics;  Laterality: N/A;  edc 02/21/15     Family History  Problem Relation Age of Onset  . Diabetes Maternal Grandmother   . Bone cancer Maternal Grandmother [phone]  . Cancer Maternal Uncle     not sure  . Cancer Maternal Grandfather     not sure  . Anesthesia problems Neg Hx   . Hypotension Neg Hx   . Malignant hyperthermia Neg Hx   . Pseudochol deficiency Neg Hx     Social History  Substance Use Topics  . Smoking status: Never Smoker  . Smokeless tobacco: Never Used  . Alcohol use 1.8 oz/week    3 Standard drinks or equivalent per week     Comment: Occas    Allergies:  Allergies  Allergen Reactions  . Anaprox [Naproxen Sodium] Rash  . Benadryl [Diphenhydramine Hcl] Rash    Facility-Administered Medications Prior to Admission  Medication Dose Route Frequency Provider Last Rate Last Dose  . ibuprofen (ADVIL,MOTRIN) tablet 600  mg  600 mg Oral Once Godfrey PickEleanore E Egan, PA-C       Prescriptions Prior to Admission  Medication Sig Dispense Refill Last Dose  . acetaminophen (TYLENOL) 325 MG tablet Take 650 mg by mouth every 6 (six) hours as needed for moderate pain.   Past Week at Unknown time  . Prenatal Vit-Fe Fumarate-FA (PRENATAL MULTIVITAMIN) TABS tablet Take 1 tablet by mouth daily at 12 noon.    03/26/2016 at Unknown time   Results for orders placed or performed during the hospital encounter of 03/27/16 (from the past 48 hour(s))  Urinalysis, Routine w reflex microscopic     Status: Abnormal   Collection Time: 03/27/16  8:24 AM  Result Value Ref Range   Color, Urine YELLOW YELLOW   APPearance HAZY (A) CLEAR   Specific Gravity, Urine 1.012 1.005 - 1.030   pH 7.0 5.0 - 8.0   Glucose, UA NEGATIVE NEGATIVE mg/dL   Hgb urine dipstick NEGATIVE NEGATIVE   Bilirubin Urine NEGATIVE NEGATIVE   Ketones, ur NEGATIVE NEGATIVE mg/dL   Protein, ur NEGATIVE NEGATIVE mg/dL   Nitrite NEGATIVE NEGATIVE   Leukocytes, UA NEGATIVE NEGATIVE  Pregnancy, urine POC     Status: Abnormal   Collection  Time: 03/27/16  8:39 AM  Result Value Ref Range   Preg Test, Ur POSITIVE (A) NEGATIVE    Comment:        THE SENSITIVITY OF THIS METHODOLOGY IS >24 mIU/mL     Review of Systems  Gastrointestinal: Negative for constipation, diarrhea, nausea and vomiting.  Genitourinary: Negative for dysuria.   Physical Exam   Blood pressure 111/62, pulse 87, temperature 98.3 F (36.8 C), temperature source Oral, resp. rate 16, last menstrual period 11/16/2015, SpO2 98 %, unknown if currently breastfeeding.  Physical Exam  Constitutional: She is oriented to person, place, and time. She appears well-developed and well-nourished. No distress.  Respiratory: Effort normal and breath sounds normal.  Genitourinary:  Genitourinary Comments: Cervix: closed, thick, anterior. No blood noted   Musculoskeletal: Normal range of motion.  Neurological: She is  alert and oriented to person, place, and time.  Skin: Skin is warm. She is not diaphoretic.  Psychiatric: Her behavior is normal.    MAU Course  Procedures  None  MDM  UA + fetal heart tones via doppler  Discussed patient with Dr. Henderson Cloudomblin  Discussed that movement at this gestation is not always consistent.   Assessment and Plan   A:  1. Round ligament pain   2. Presence of fetal heart sounds in first trimester     P:  Discharge home in stable condition Return precautions discussed Follow up with Dr. Henderson Cloudomblin as scheduled Pregnancy support belt recommended   Duane LopeJennifer I Rasch, NP 03/27/2016 9:39 AM

## 2016-07-08 ENCOUNTER — Emergency Department (HOSPITAL_COMMUNITY): Payer: BC Managed Care – PPO

## 2016-07-08 ENCOUNTER — Encounter (HOSPITAL_COMMUNITY): Payer: Self-pay | Admitting: Emergency Medicine

## 2016-07-08 ENCOUNTER — Emergency Department (HOSPITAL_COMMUNITY)
Admission: EM | Admit: 2016-07-08 | Discharge: 2016-07-08 | Disposition: A | Discharge status: Home / Self Care | Payer: BC Managed Care – PPO | Attending: Emergency Medicine | Admitting: Emergency Medicine

## 2016-07-08 DIAGNOSIS — Y999 Unspecified external cause status: Secondary | ICD-10-CM | POA: Insufficient documentation

## 2016-07-08 DIAGNOSIS — W01198A Fall on same level from slipping, tripping and stumbling with subsequent striking against other object, initial encounter: Secondary | ICD-10-CM | POA: Insufficient documentation

## 2016-07-08 DIAGNOSIS — Z3A31 31 weeks gestation of pregnancy: Secondary | ICD-10-CM | POA: Diagnosis not present

## 2016-07-08 DIAGNOSIS — Z3490 Encounter for supervision of normal pregnancy, unspecified, unspecified trimester: Secondary | ICD-10-CM

## 2016-07-08 DIAGNOSIS — M79643 Pain in unspecified hand: Secondary | ICD-10-CM | POA: Diagnosis not present

## 2016-07-08 DIAGNOSIS — Y939 Activity, unspecified: Secondary | ICD-10-CM | POA: Insufficient documentation

## 2016-07-08 DIAGNOSIS — O9989 Other specified diseases and conditions complicating pregnancy, childbirth and the puerperium: Secondary | ICD-10-CM | POA: Insufficient documentation

## 2016-07-08 DIAGNOSIS — W19XXXA Unspecified fall, initial encounter: Secondary | ICD-10-CM

## 2016-07-08 DIAGNOSIS — Y9289 Other specified places as the place of occurrence of the external cause: Secondary | ICD-10-CM | POA: Insufficient documentation

## 2016-07-08 LAB — COMPREHENSIVE METABOLIC PANEL
ALT: 11 U/L — AB (ref 14–54)
AST: 21 U/L (ref 15–41)
Albumin: 2.9 g/dL — ABNORMAL LOW (ref 3.5–5.0)
Alkaline Phosphatase: 79 U/L (ref 38–126)
Anion gap: 10 (ref 5–15)
BILIRUBIN TOTAL: 0.5 mg/dL (ref 0.3–1.2)
BUN: 5 mg/dL — AB (ref 6–20)
CO2: 20 mmol/L — ABNORMAL LOW (ref 22–32)
CREATININE: 0.5 mg/dL (ref 0.44–1.00)
Calcium: 8.9 mg/dL (ref 8.9–10.3)
Chloride: 104 mmol/L (ref 101–111)
GFR calc Af Amer: >60 mL/min (ref >60)
Glucose, Bld: 100 mg/dL — ABNORMAL HIGH (ref 65–99)
POTASSIUM: 4 mmol/L (ref 3.5–5.1)
Sodium: 134 mmol/L — ABNORMAL LOW (ref 135–145)
TOTAL PROTEIN: 6.1 g/dL — AB (ref 6.5–8.1)

## 2016-07-08 LAB — CBC WITH DIFFERENTIAL/PLATELET
BASOS ABS: 0 10*3/uL (ref 0.0–0.1)
Basophils Relative: 0 %
Eosinophils Absolute: 0.1 10*3/uL (ref 0.0–0.7)
Eosinophils Relative: 2 %
HEMATOCRIT: 35.4 % — AB (ref 36.0–46.0)
Hemoglobin: 12.1 g/dL (ref 12.0–15.0)
LYMPHS PCT: 21 %
Lymphs Abs: 1.8 10*3/uL (ref 0.7–4.0)
MCH: 27.4 pg (ref 26.0–34.0)
MCHC: 34.2 g/dL (ref 30.0–36.0)
MCV: 80.1 fL (ref 78.0–100.0)
MONO ABS: 0.5 10*3/uL (ref 0.1–1.0)
Monocytes Relative: 6 %
NEUTROS ABS: 5.9 10*3/uL (ref 1.7–7.7)
Neutrophils Relative %: 71 %
Platelets: 178 10*3/uL (ref 150–400)
RBC: 4.42 MIL/uL (ref 3.87–5.11)
RDW: 14.6 % (ref 11.5–15.5)
WBC: 8.4 10*3/uL (ref 4.0–10.5)

## 2016-07-08 LAB — URINALYSIS, ROUTINE W REFLEX MICROSCOPIC
Bilirubin Urine: NEGATIVE
GLUCOSE, UA: NEGATIVE mg/dL
Hgb urine dipstick: NEGATIVE
Ketones, ur: NEGATIVE mg/dL
LEUKOCYTES UA: NEGATIVE
Nitrite: NEGATIVE
PH: 7 (ref 5.0–8.0)
PROTEIN: NEGATIVE mg/dL
Specific Gravity, Urine: 1.004 — ABNORMAL LOW (ref 1.005–1.030)

## 2016-07-08 MED ORDER — SODIUM CHLORIDE 0.9 % IV BOLUS (SEPSIS)
1000.0000 mL | Freq: Once | INTRAVENOUS | Status: AC
  Administered 2016-07-08: 1000 mL via INTRAVENOUS

## 2016-07-08 NOTE — ED Provider Notes (Signed)
[facility]-EMERGENCY DEPT Provider Note   CSN: [id number] Arrival date & time: 07/08/16  0103  By signing my name below, I, Cynda Acres, attest that this documentation has been prepared under the direction and in the presence of Glynn Octave, MD. Electronically Signed: Cynda Acres, Scribe. 07/08/16. 1:26 AM.  History   Chief Complaint Chief Complaint  Patient presents with  . Fall   HPI Comments: Cathy Woodard is a 26 y.o. female with a history of gastational DM and HLD, who presents to the Emergency Department by ambulance, complaining of a sudden-onset hand pain s/p mechanical fall that happened earlier tonight. Patient reports tripping over the side of a curb, landing on her hands, knees, and abdomen. Patient is [redacted] weeks pregnant with gestational diabetes (G:5, P:3; eptopic:1, miscarriage:1). Patient reports associated knee pain. No modifying factors indicated. Patient denies any nausea, vomiting, back pain, abdominal pain, vaginal bleeding, vaginal fluid, or contractions.   EMS vital signs: BP 128/82, HR 80, RR 18, CBG 93.  HPI  Past Medical History:  Diagnosis Date  . Allergy   . Hypercholesteremia   . No pertinent past medical history     Patient Active Problem List   Diagnosis Date Noted  . Previous cesarean section 02/14/2015    Past Surgical History:  Procedure Laterality Date  . CESAREAN SECTION    . CESAREAN SECTION N/A 02/14/2015   Procedure: CESAREAN SECTION;  Surgeon: Harold Hedge, MD;  Location: [facility];  Service: Obstetrics;  Laterality: N/A;  edc 02/21/15     OB History    Gravida Para Term Preterm AB Living   0 2 2   SAB TAB Ectopic Multiple Live Births   1 0 1 0 2       Home Medications    Prior to Admission medications   Medication Sig Start Date End Date Taking? Authorizing Provider  acetaminophen (TYLENOL) 325 MG tablet Take 650 mg by mouth every 6 (six) hours as needed for moderate pain.    Historical Provider, MD  Prenatal  Vit-Fe Fumarate-FA (PRENATAL MULTIVITAMIN) TABS tablet Take 1 tablet by mouth daily at 12 noon.     Historical Provider, MD    Family History Family History  Problem Relation Age of Onset  . Diabetes Maternal Grandmother   . Bone cancer Maternal Grandmother 70  . Cancer Maternal Uncle     not sure  . Cancer Maternal Grandfather     not sure  . Anesthesia problems Neg Hx   . Hypotension Neg Hx   . Malignant hyperthermia Neg Hx   . Pseudochol deficiency Neg Hx     Social History Social History  Substance Use Topics  . Smoking status: Never Smoker  . Smokeless tobacco: Never Used  . Alcohol use 1.8 oz/week    3 Standard drinks or equivalent per week     Comment: Occas     Allergies   Anaprox [naproxen sodium] and Benadryl [diphenhydramine hcl]   Review of Systems Review of Systems   A complete 10 system review of systems was obtained and all systems are negative except as noted in the HPI and PMH.   Physical Exam Updated Vital Signs BP 91/67   Pulse 88   LMP 11/16/2015   SpO2 98%   Physical Exam  Constitutional: She is oriented to person, place, and time. She appears well-developed and well-nourished. No distress.  HENT:  Head: Normocephalic and atraumatic.  Mouth/Throat: Oropharynx is clear and moist. No oropharyngeal exudate.  Eyes: Conjunctivae and EOM are normal. Pupils are equal, round, and reactive to light.  Neck: Normal range of motion. Neck supple.  No meningismus.  Cardiovascular: Normal rate, regular rhythm, normal heart sounds and intact distal pulses.   No murmur heard. Pulmonary/Chest: Effort normal and breath sounds normal. No respiratory distress.  Abdominal: Soft. There is no tenderness. There is no rebound and no guarding.  Gravid abdomen, non tender.  Musculoskeletal: Normal range of motion. She exhibits no edema, tenderness or deformity.  Full ROM of bilateral wrist and knees without pain. No T/L spine tenderness.   Neurological: She is  alert and oriented to person, place, and time. No cranial nerve deficit. She exhibits normal muscle tone. Coordination normal.   5/5 strength throughout. CN 2-12 intact.Equal grip strength.   Skin: Skin is warm.  Psychiatric: She has a normal mood and affect. Her behavior is normal.  Nursing note and vitals reviewed.    ED Treatments / Results  COORDINATION OF CARE: 1:26 AM Discussed treatment plan with pt at bedside and pt agreed to plan, which includes a abdominal US and observation.   Labs (all labs ordered are listed, but only abnormal results are displayed) Labs Reviewed  URINALYSIS, ROUTINE W REFLEX MICROSCOPIC - Abnormal; Notable for the following:       Result Value   APPearance HAZY (*)    Specific Gravity, Urine 1.004 (*)    All other components within normal limits  CBC WITH DIFFERENTIAL/PLATELET - Abnormal; Notable for the following:    HCT 35.4 (*)    All other components within normal limits  COMPREHENSIVE METABOLIC PANEL - Abnormal; Notable for the following:    Sodium 134 (*)    CO2 20 (*)    Glucose, Bld 100 (*)    BUN 5 (*)    Total Protein 6.1 (*)    Albumin 2.9 (*)    ALT 11 (*)    All other components within normal limits    EKG  EKG Interpretation None       Radiology US Ob Comp + 14 Wk  Result Date: 07/08/2016 CLINICAL DATA:  Larey Seat tonight. EXAM: LIMITED OBSTETRIC ULTRASOUND FINDINGS: Number of Fetuses: 1 Heart Rate:  127 bpm Movement: Visible Presentation: Cephalic Placental Location: Posterior Previa: No Amniotic Fluid (Subjective):  Within normal limits. BPD:  8.38cm 33w  5d MATERNAL FINDINGS: Cervix:  Appears closed. Uterus/Adnexae:  No abnormality visualized. IMPRESSION: Single living intrauterine gestation. Normal fluid volume. Cephalic position. Placenta appears intact, without previa. This exam is performed on an emergent basis and does not comprehensively evaluate fetal size, dating, or anatomy; follow-up complete OB US should be considered if  further fetal assessment is warranted. Electronically Signed   By: Ellery Plunk M.D.   On: 07/08/2016 04:41    Procedures Procedures (including critical care time)  Medications Ordered in ED Medications - No data to display   Initial Impression / Assessment and Plan / ED Course  I have reviewed the triage vital signs and the nursing notes.  Pertinent labs & imaging results that were available during my care of the patient were reviewed by me and considered in my medical decision making (see chart for details).      Patient presents after fall while tripping over a curb. She fell forward onto her hands and knees and hit her lower abdomen on the cement. She is [redacted] weeks pregnant. G5 P3. Denies abdominal pain, vaginal bleeding, head or neck pain.  Musculoskeletal exam does not show  any discrete bony tenderness.  Patient placed on monitor with OB nurse at bedside. 2:01 AM Per OB nurse the patient is contracting every 4 minutes.  IVF started.  Contractions resolved after IV fluids. Per doctor Elon Spanner, patient can be discharged if fetal tracing is reassuring after one hour of additional monitoring.  Patient remains stable. Cleared by OB rapid response nurse for discharge. She denies any abdominal pain, contractions, vaginal bleeding or leak of fluid. [place] is reassuring without evidence of placental abruption or previa.  Patient is tolerating PO and ambulatory. Follow up with her OB doctor. Return precautions discussed.  Final Clinical Impressions(s) / ED Diagnoses   Final diagnoses:  Fall, initial encounter  Pregnancy, unspecified gestational age    New Prescriptions New Prescriptions   No medications on file   I personally performed the services described in this documentation, which was scribed in my presence. The recorded information has been reviewed and is accurate.     Glynn Octave, MD 07/08/16 [phone]

## 2016-07-08 NOTE — Discharge Instructions (Signed)
Keep yourself hydrated. Followup with your OB doctor. Go to [facility] hospital if you have contractions, leaking of fluid, or vaginal bleeding.

## 2016-07-08 NOTE — Progress Notes (Signed)
EFM removed after reactive tracing per Dr Elon Spanner; Dr Manus Gunning made aware at this time; patient instructed to notify staff of pain, decreased movement, bleeding, or leaking of fluid; patient verbalized understanding

## 2016-07-08 NOTE — ED Notes (Signed)
Patient hooked up to FHR monitor. OBRRN at bedside.

## 2016-07-08 NOTE — Progress Notes (Signed)
RROB called to patient's bedside who presents to [facility] ED with complaints of a fall around 1210 on 07/07/16; patient states she was coming home from work and tripped over the curb and caught herself on hands and knees but then fell to her right side; patient denies bleeding, LOF, or pain at this time; scraps noted to knees, hands, and lower abd; EFM applied and assessing at this time

## 2016-07-08 NOTE — ED Notes (Signed)
Pt to us via stretcher

## 2016-07-08 NOTE — Progress Notes (Signed)
Dr Elon Spanner notified of patient's arrival and complaint; informed of reactive FHR and contractions q3-25mins; notes patient is not feeling contractions at this time; SVE closed/thick/high; no blood noted on exam; orders given for fluid bolus and if strip is reactive within an hour of beginning of monitoring patient may be discharged home with follow-up instructions

## 2016-07-08 NOTE — ED Triage Notes (Signed)
Per [facility]: Pt ambulatory to ED after tripping over side curb [place] her apartment complex. Pt caught herself, fell on hands and knees and also hit the RLQ of her abdomen - small abrasion noted, no bruising. Pt is [redacted] weeks pregnant. Denies abdominal pain, N/V, injuries. Patient called EMS to have the baby checked because she hasn't felt him move (but states that is normal at this time of night). EMS VS: 128/82, HR 80, RR 18, CBG 93. Hx gestational DM. Patient A&O x 4.

## 2016-07-18 ENCOUNTER — Inpatient Hospital Stay (HOSPITAL_COMMUNITY)
Admission: AD | Admit: 2016-07-18 | Discharge: 2016-07-19 | Disposition: A | Discharge status: Home / Self Care | Payer: BC Managed Care – PPO | Source: Ambulatory Visit | Attending: Obstetrics and Gynecology | Admitting: Obstetrics and Gynecology

## 2016-07-18 ENCOUNTER — Encounter (HOSPITAL_COMMUNITY): Payer: Self-pay | Admitting: *Deleted

## 2016-07-18 DIAGNOSIS — B9789 Other viral agents as the cause of diseases classified elsewhere: Secondary | ICD-10-CM

## 2016-07-18 DIAGNOSIS — O99283 Endocrine, nutritional and metabolic diseases complicating pregnancy, third trimester: Secondary | ICD-10-CM | POA: Insufficient documentation

## 2016-07-18 DIAGNOSIS — O9989 Other specified diseases and conditions complicating pregnancy, childbirth and the puerperium: Secondary | ICD-10-CM | POA: Diagnosis not present

## 2016-07-18 DIAGNOSIS — Z3A33 33 weeks gestation of pregnancy: Secondary | ICD-10-CM | POA: Diagnosis not present

## 2016-07-18 DIAGNOSIS — J029 Acute pharyngitis, unspecified: Secondary | ICD-10-CM | POA: Diagnosis present

## 2016-07-18 DIAGNOSIS — E7800 Pure hypercholesterolemia, unspecified: Secondary | ICD-10-CM | POA: Diagnosis not present

## 2016-07-18 DIAGNOSIS — O24419 Gestational diabetes mellitus in pregnancy, unspecified control: Secondary | ICD-10-CM | POA: Insufficient documentation

## 2016-07-18 DIAGNOSIS — R05 Cough: Secondary | ICD-10-CM | POA: Diagnosis present

## 2016-07-18 DIAGNOSIS — R509 Fever, unspecified: Secondary | ICD-10-CM | POA: Diagnosis present

## 2016-07-18 DIAGNOSIS — O99513 Diseases of the respiratory system complicating pregnancy, third trimester: Secondary | ICD-10-CM | POA: Diagnosis not present

## 2016-07-18 DIAGNOSIS — J069 Acute upper respiratory infection, unspecified: Secondary | ICD-10-CM | POA: Insufficient documentation

## 2016-07-18 HISTORY — DX: Gestational diabetes mellitus in pregnancy, unspecified control: O24.419

## 2016-07-18 LAB — URINALYSIS, ROUTINE W REFLEX MICROSCOPIC
BILIRUBIN URINE: NEGATIVE
GLUCOSE, UA: NEGATIVE mg/dL
HGB URINE DIPSTICK: NEGATIVE
Ketones, ur: 80 mg/dL — AB
Leukocytes, UA: NEGATIVE
Nitrite: NEGATIVE
PH: 6 (ref 5.0–8.0)
Protein, ur: NEGATIVE mg/dL
SPECIFIC GRAVITY, URINE: 1.014 (ref 1.005–1.030)

## 2016-07-18 LAB — INFLUENZA PANEL BY PCR (TYPE A & B)
INFLAPCR: NEGATIVE
Influenza B By PCR: NEGATIVE

## 2016-07-18 MED ORDER — ACETAMINOPHEN 500 MG PO TABS
1000.0000 mg | ORAL_TABLET | Freq: Once | ORAL | Status: AC
  Administered 2016-07-18: 1000 mg via ORAL
  Filled 2016-07-18: qty 2

## 2016-07-18 MED ORDER — LACTATED RINGERS IV BOLUS (SEPSIS)
1000.0000 mL | Freq: Once | INTRAVENOUS | Status: AC
Start: 2016-07-18 — End: 2016-07-18
  Administered 2016-07-18: 1000 mL via INTRAVENOUS

## 2016-07-18 NOTE — MAU Provider Note (Signed)
History     CSN: [id number]  Arrival date and time: 07/18/16 2107   First Provider Initiated Contact with Patient 07/18/16 2210      Chief Complaint  Patient presents with  . Cough  . Contractions   Cathy Woodard is a 26 y.o. [id number] at [redacted]w[redacted]d who presents today with fever, cough, sore throat since yesterday. She states that her son had the same symptoms last week. She also reports contractions. She denies any VB or LOF. She reports normal fetal movement.    Cough  This is a new problem. The current episode started yesterday. The problem has been unchanged. The cough is productive of purulent sputum. Associated symptoms include chills, a fever and a sore throat. Nothing aggravates the symptoms. She has tried nothing for the symptoms.    Past Medical History:  Diagnosis Date  . Allergy   . Gestational diabetes   . Hypercholesteremia   . No pertinent past medical history     Past Surgical History:  Procedure Laterality Date  . CESAREAN SECTION    . CESAREAN SECTION N/A 02/14/2015   Procedure: CESAREAN SECTION;  Surgeon: Harold Hedge, MD;  Location: [facility];  Service: Obstetrics;  Laterality: N/A;  edc 02/21/15     Family History  Problem Relation Age of Onset  . Diabetes Maternal Grandmother   . Bone cancer Maternal Grandmother 85  . Cancer Maternal Uncle     not sure  . Cancer Maternal Grandfather     not sure  . Anesthesia problems Neg Hx   . Hypotension Neg Hx   . Malignant hyperthermia Neg Hx   . Pseudochol deficiency Neg Hx     Social History  Substance Use Topics  . Smoking status: Never Smoker  . Smokeless tobacco: Never Used  . Alcohol use 1.8 oz/week    3 Standard drinks or equivalent per week     Comment: Occas    Allergies:  Allergies  Allergen Reactions  . Anaprox [Naproxen Sodium] Rash  . Benadryl [Diphenhydramine Hcl] Rash    Prescriptions Prior to Admission  Medication Sig Dispense Refill Last Dose  . Doxylamine-Pyridoxine  (DICLEGIS) 10-10 MG TBEC Take 1 tablet by mouth as needed (for nausea).   07/17/2016 at Unknown time  . Prenatal Vit-Fe Fumarate-FA (PRENATAL MULTIVITAMIN) TABS tablet Take 1 tablet by mouth daily at 12 noon.    07/17/2016 at Unknown time    Review of Systems  Constitutional: Positive for chills and fever.  HENT: Positive for sore throat.   Respiratory: Positive for cough.   Genitourinary: Positive for pelvic pain. Negative for vaginal bleeding and vaginal discharge.   Physical Exam   Blood pressure 96/64, pulse (!) 127, temperature (!) 101.2 F (38.4 C), temperature source Oral, resp. rate 20, height  (1.626 m), weight 104.8 kg (231 lb), last menstrual period 11/16/2015, unknown if currently breastfeeding.  Physical Exam  Nursing note and vitals reviewed. Constitutional: She is oriented to person, place, and time. She appears well-developed and well-nourished. No distress.  HENT:  Head: Normocephalic.  Mouth/Throat: No oropharyngeal exudate, posterior oropharyngeal edema, posterior oropharyngeal erythema or tonsillar abscesses.  Cardiovascular: Normal rate.   Respiratory: Effort normal.  GI: Soft. There is no tenderness. There is no rebound.  Genitourinary:  Genitourinary Comments: Cervix: closed/thick/ballotable   Neurological: She is alert and oriented to person, place, and time.  Skin: Skin is warm and dry.[id number]: She has a normal mood and affect.   Results for orders placed or  performed during the hospital encounter of 07/18/16 (from the past 24 hour(s))  Urinalysis, Routine w reflex microscopic     Status: Abnormal   Collection Time: 07/18/16  9:20 PM  Result Value Ref Range   Color, Urine YELLOW YELLOW   APPearance HAZY (A) CLEAR   Specific Gravity, Urine 1.014 1.005 - 1.030   pH 6.0 5.0 - 8.0   Glucose, UA NEGATIVE NEGATIVE mg/dL   Hgb urine dipstick NEGATIVE NEGATIVE   Bilirubin Urine NEGATIVE NEGATIVE   Ketones, ur 80 (A) NEGATIVE mg/dL   Protein, ur  NEGATIVE NEGATIVE mg/dL   Nitrite NEGATIVE NEGATIVE   Leukocytes, UA NEGATIVE NEGATIVE  Influenza panel by PCR (type A & B)     Status: None   Collection Time: 07/18/16 10:15 PM  Result Value Ref Range   Influenza A By PCR NEGATIVE NEGATIVE   Influenza B By PCR NEGATIVE NEGATIVE   FHT: 155, moderate with 15x15 accels, no decels Toco: about every 5- 12 mins  MAU Course  Procedures  MDM Fever improved with tylenol and IV fluids. Patient reports that contractions are less intense than when she first arrived.   2348: DW ok for DC home. Will treat as viral URI as son has similar illness last week.   Assessment and Plan   1. Viral URI with cough   2. [redacted] weeks gestation of pregnancy    DC home Comfort measures reviewed  3rd Trimester precautions  PTL precautions  Fetal kick counts RX: none, list of safe medications given  Return to MAU as needed FU with OB as planned  Follow-up Information    Ranae Pila, MD Follow up.   Specialty:  Obstetrics and Gynecology Contact information: [address] [place] [address] [phone]            Tawnya Crook 07/18/2016, 10:12 PM

## 2016-07-18 NOTE — MAU Note (Signed)
Started with cough yesterday, today sore throat, yellow sputum, vomiting and chills. Reports +FM, denies vag bleeding, leaking or discharge.

## 2016-07-18 NOTE — Discharge Instructions (Signed)

## 2016-07-19 LAB — RAPID STREP SCREEN (MED CTR MEBANE ONLY): Streptococcus, Group A Screen (Direct): NEGATIVE

## 2016-07-21 LAB — CULTURE, GROUP A STREP (THRC)

## 2016-07-28 ENCOUNTER — Ambulatory Visit: Payer: BC Managed Care – PPO | Admitting: Registered"

## 2016-08-11 ENCOUNTER — Encounter: Payer: Self-pay | Admitting: Registered"

## 2016-08-11 ENCOUNTER — Encounter: Payer: BC Managed Care – PPO | Attending: Obstetrics and Gynecology | Admitting: Registered"

## 2016-08-11 DIAGNOSIS — O99810 Abnormal glucose complicating pregnancy: Secondary | ICD-10-CM | POA: Insufficient documentation

## 2016-08-11 DIAGNOSIS — R7309 Other abnormal glucose: Secondary | ICD-10-CM

## 2016-08-11 DIAGNOSIS — Z3A00 Weeks of gestation of pregnancy not specified: Secondary | ICD-10-CM | POA: Diagnosis not present

## 2016-08-11 NOTE — Progress Notes (Signed)
Patient was seen on 08/11/16 for Gestational Diabetes self-management class at the [facility]. The following learning objectives were met by the patient during this course:   States the definition of Gestational Diabetes  States why dietary management is important in controlling blood glucose  Describes the effects each nutrient has on blood glucose levels  Demonstrates ability to create a balanced meal plan  Demonstrates carbohydrate counting   States when to check blood glucose levels  Demonstrates proper blood glucose monitoring techniques  States the effect of stress and exercise on blood glucose levels  States the importance of limiting caffeine and abstaining from alcohol and smoking  Blood glucose monitor given: Accu-chek Guide Lot # [id number] Exp: 06/23/2017 Blood glucose reading: 143  Patient instructed to monitor glucose levels: FBS: 60 - <90 1 hour: <140 2 hour: <120  *Patient received handouts:  Nutrition Diabetes and Pregnancy  Carbohydrate Counting List  Patient will be seen for follow-up as needed.

## 2016-08-21 ENCOUNTER — Encounter (HOSPITAL_COMMUNITY): Payer: Self-pay | Admitting: *Deleted

## 2016-08-21 ENCOUNTER — Inpatient Hospital Stay (HOSPITAL_COMMUNITY)
Admission: AD | Admit: 2016-08-21 | Discharge: 2016-08-21 | Disposition: A | Discharge status: Home / Self Care | Payer: BC Managed Care – PPO | Source: Ambulatory Visit | Attending: Obstetrics and Gynecology | Admitting: Obstetrics and Gynecology

## 2016-08-21 DIAGNOSIS — O479 False labor, unspecified: Secondary | ICD-10-CM

## 2016-08-21 DIAGNOSIS — Z3A38 38 weeks gestation of pregnancy: Secondary | ICD-10-CM | POA: Insufficient documentation

## 2016-08-21 DIAGNOSIS — Z888 Allergy status to other drugs, medicaments and biological substances status: Secondary | ICD-10-CM | POA: Insufficient documentation

## 2016-08-21 DIAGNOSIS — O26893 Other specified pregnancy related conditions, third trimester: Secondary | ICD-10-CM | POA: Insufficient documentation

## 2016-08-21 DIAGNOSIS — R102 Pelvic and perineal pain: Secondary | ICD-10-CM | POA: Diagnosis not present

## 2016-08-21 DIAGNOSIS — O24419 Gestational diabetes mellitus in pregnancy, unspecified control: Secondary | ICD-10-CM | POA: Insufficient documentation

## 2016-08-21 DIAGNOSIS — O471 False labor at or after 37 completed weeks of gestation: Secondary | ICD-10-CM | POA: Diagnosis not present

## 2016-08-21 DIAGNOSIS — Z3689 Encounter for other specified antenatal screening: Secondary | ICD-10-CM

## 2016-08-21 DIAGNOSIS — R109 Unspecified abdominal pain: Secondary | ICD-10-CM | POA: Diagnosis present

## 2016-08-21 DIAGNOSIS — O34219 Maternal care for unspecified type scar from previous cesarean delivery: Secondary | ICD-10-CM | POA: Diagnosis not present

## 2016-08-21 DIAGNOSIS — Z98891 History of uterine scar from previous surgery: Secondary | ICD-10-CM

## 2016-08-21 DIAGNOSIS — N949 Unspecified condition associated with female genital organs and menstrual cycle: Secondary | ICD-10-CM

## 2016-08-21 NOTE — MAU Provider Note (Signed)
History     CSN: [id number]  Arrival date and time: 08/21/16 0021   None     Chief Complaint  Patient presents with  . Contractions   [id number] @38  weeks here for a labor check but asked to see pt for right abdominal pain. Pain started this evening. Only occurs with walking and standing. Pain is bilateral in the lower abdomen but also radiates mid way up abdomen on the right side. Reports good FM. No VB or LOF. She is having irregular ctx. Her pregnancy is complicated by A1GDM and previous CS x2-for repeat CS in 1 week.      OB History    Gravida Para Term Preterm AB Living   5 2 2  0 2 2   SAB TAB Ectopic Multiple Live Births   1 0 1 0 2      Past Medical History:  Diagnosis Date  . Allergy   . Gestational diabetes   . Hypercholesteremia   . No pertinent past medical history     Past Surgical History:  Procedure Laterality Date  . CESAREAN SECTION    . CESAREAN SECTION N/A 02/14/2015   Procedure: CESAREAN SECTION;  Surgeon: Harold Hedge, MD;  Location: [facility];  Service: Obstetrics;  Laterality: N/A;  edc 02/21/15     Family History  Problem Relation Age of Onset  . Diabetes Maternal Grandmother   . Bone cancer Maternal Grandmother 11  . Cancer Maternal Uncle        not sure  . Cancer Maternal Grandfather        not sure  . Anesthesia problems Neg Hx   . Hypotension Neg Hx   . Malignant hyperthermia Neg Hx   . Pseudochol deficiency Neg Hx     Social History  Substance Use Topics  . Smoking status: Never Smoker  . Smokeless tobacco: Never Used  . Alcohol use 1.8 oz/week    3 Standard drinks or equivalent per week     Comment: Occas    Allergies:  Allergies  Allergen Reactions  . Anaprox [Naproxen Sodium] Rash  . Benadryl [Diphenhydramine Hcl] Rash    Prescriptions Prior to Admission  Medication Sig Dispense Refill Last Dose  . Doxylamine-Pyridoxine (DICLEGIS) 10-10 MG TBEC Take 1 tablet by mouth as needed (for nausea).   08/20/2016 at Unknown time   . Prenatal Vit-Fe Fumarate-FA (PRENATAL MULTIVITAMIN) TABS tablet Take 1 tablet by mouth daily at 12 noon.    08/20/2016 at Unknown time    Review of Systems  Gastrointestinal: Positive for abdominal pain.  Genitourinary: Negative for vaginal bleeding and vaginal discharge.   Physical Exam   Blood pressure 110/62, pulse 86, temperature 97.5 F (36.4 C), resp. rate 18, height 5\' 4"  (1.626 m), weight 108.4 kg (239 lb), last menstrual period 11/16/2015, SpO2 99 %, unknown if currently breastfeeding.  Physical Exam  Constitutional: She is oriented to person, place, and time. She appears well-developed and well-nourished. No distress (appears comfortable).  HENT:  Head: Normocephalic and atraumatic.  Neck: Normal range of motion.  Cardiovascular: Normal rate.   Respiratory: Effort normal.  GI: Soft. She exhibits no distension. There is no tenderness.  gravid  Genitourinary:  Genitourinary Comments: Cervix closed/uneffaced per RN check  Musculoskeletal: Normal range of motion.  Neurological: She is alert and oriented to person, place, and time.  Skin: Skin is warm and dry.  Psychiatric: She has a normal mood and affect.   EFM: 140 bpm, mod variability, + accels, no decels Toco:  irregular  MAU Course  Procedures Offered Flexeril but pt is driving so declined  MDM No evidence of labor. Pain likely caused by RL and advancing pregnancy. Dr. Vincente PoliGrewal notified of presentation and clinical findings.   Assessment and Plan   1. [redacted] weeks gestation of pregnancy   2. NST (non-stress test) reactive   3. GDM, class A2   4. Round ligament pain   5. False labor   6. Previous cesarean section    Discharge home Follow up in OB office in 2 days Labor precautions Warm shower Tylenol prn Off feet, rest, position changes  Allergies as of 08/21/2016      Reactions   Anaprox [naproxen Sodium] Rash   Benadryl [diphenhydramine Hcl] Rash      Medication List    TAKE these medications    DICLEGIS 10-10 MG Tbec Generic drug:  Doxylamine-Pyridoxine Take 1 tablet by mouth as needed (for nausea).   prenatal multivitamin Tabs tablet Take 1 tablet by mouth daily at 12 noon.      Cathy Woodard, CNM 08/21/2016, 1:55 AM

## 2016-08-21 NOTE — MAU Note (Signed)
Ctxs all day. Have Braxton hicks a lot but these are stronger. For repeat C/S next Sat. Denies LOF or bleeding.

## 2016-08-21 NOTE — Discharge Instructions (Signed)
Back Pain in Pregnancy °Back pain during pregnancy is common. Back pain may be caused by several factors that are related to changes during your pregnancy. °Follow these instructions at home: °Managing pain, stiffness, and swelling  °· If directed, apply ice for sudden (acute) back pain. °¨ Put ice in a plastic bag. °¨ Place a towel between your skin and the bag. °¨ Leave the ice on for 20 minutes, 2-3 times per day. °· If directed, apply heat to the affected area before you exercise: °¨ Place a towel between your skin and the heat pack or heating pad. °¨ Leave the heat on for 20-30 minutes. °¨ Remove the heat if your skin turns bright red. This is especially important if you are unable to feel pain, heat, or cold. You may have a greater risk of getting burned. °Activity  °· Exercise as told by your health care provider. Exercising is the best way to prevent or manage back pain. °· Listen to your body when lifting. If lifting hurts, ask for help or bend your knees. This uses your leg muscles instead of your back muscles. °· Squat down when picking up something from the floor. Do not bend over. °· Only use bed rest as told by your health care provider. Bed rest should only be used for the most severe episodes of back pain. °Standing, Sitting, and Lying Down  °· Do not stand in one place for long periods of time. °· Use good posture when sitting. Make sure your head rests over your shoulders and is not hanging forward. Use a pillow on your lower back if necessary. °· Try sleeping on your side, preferably the left side, with a pillow or two between your legs. If you are sore after a night's rest, your bed may be too soft. A firm mattress may provide more support for your back during pregnancy. °General instructions  °· Do not wear high heels. °· Eat a healthy diet. Try to gain weight within your health care provider's recommendations. °· Use a maternity girdle, elastic sling, or back brace as told by your health care  provider. °· Take over-the-counter and prescription medicines only as told by your health care provider. °· Keep all follow-up visits as told by your health care provider. This is important. This includes any visits with any specialists, such as a physical therapist. °Contact a health care provider if: °· Your back pain interferes with your daily activities. °· You have increasing pain in other parts of your body. °Get help right away if: °· You develop numbness, tingling, weakness, or problems with the use of your arms or legs. °· You develop severe back pain that is not controlled with medicine. °· You have a sudden change in bowel or bladder control. °· You develop shortness of breath, dizziness, or you faint. °· You develop nausea, vomiting, or sweating. °· You have back pain that is a rhythmic, cramping pain similar to labor pains. Labor pain is usually 1-2 minutes apart, lasts for about 1 minute, and involves a bearing down feeling or pressure in your pelvis. °· You have back pain and your water breaks or you have vaginal bleeding. °· You have back pain or numbness that travels down your leg. °· Your back pain developed after you fell. °· You develop pain on one side of your back. °· You see blood in your urine. °· You develop skin blisters in the area of your back pain. °This information is not intended to replace   advice given to you by your health care provider. Make sure you discuss any questions you have with your health care provider. Document Released: 06/30/2005 Document Revised: 08/28/2015 Document Reviewed: 12/04/2014 Elsevier Interactive Patient Education  2017 [employer] Ball CorporationBraxton Hicks Contractions Contractions of the uterus can occur throughout pregnancy, but they are not always a sign that you are in labor. You may have practice contractions called Braxton Hicks contractions. These false labor contractions are sometimes confused with true labor. What are Cathy PeltonBraxton Hicks  contractions? Braxton Hicks contractions are tightening movements that occur in the muscles of the uterus before labor. Unlike true labor contractions, these contractions do not result in opening (dilation) and thinning of the cervix. Toward the end of pregnancy (32-34 weeks), Braxton Hicks contractions can happen more often and may become stronger. These contractions are sometimes difficult to tell apart from true labor because they can be very uncomfortable. You should not feel embarrassed if you go to the hospital with false labor. Sometimes, the only way to tell if you are in true labor is for your health care provider to look for changes in the cervix. The health care provider will do a physical exam and may monitor your contractions. If you are not in true labor, the exam should show that your cervix is not dilating and your water has not broken. If there are no prenatal problems or other health problems associated with your pregnancy, it is completely safe for you to be sent home with false labor. You may continue to have Braxton Hicks contractions until you go into true labor. How can I tell the difference between true labor and false labor?  Differences  False labor  Contractions last 30-70 seconds.: Contractions are usually shorter and not as strong as true labor contractions.  Contractions become very regular.: Contractions are usually irregular.  Discomfort is usually felt in the top of the uterus, and it spreads to the lower abdomen and low back.: Contractions are often felt in the front of the lower abdomen and in the groin.  Contractions do not go away with walking.: Contractions may go away when you walk around or change positions while lying down.  Contractions usually become more intense and increase in frequency.: Contractions get weaker and are shorter-lasting as time goes on.  The cervix dilates and gets thinner.: The cervix usually does not dilate or become thin. Follow  these instructions at home:  Take over-the-counter and prescription medicines only as told by your health care provider.  Keep up with your usual exercises and follow other instructions from your health care provider.  Eat and drink lightly if you think you are going into labor.  If Braxton Hicks contractions are making you uncomfortable:  Change your position from lying down or resting to walking, or change from walking to resting.  Sit and rest in a tub of warm water.  Drink enough fluid to keep your urine clear or pale yellow. Dehydration may cause these contractions.  Do slow and deep breathing several times an hour.  Keep all follow-up prenatal visits as told by your health care provider. This is important. Contact a health care provider if:  You have a fever.  You have continuous pain in your abdomen. Get help right away if:  Your contractions become stronger, more regular, and closer together.  You have fluid leaking or gushing from your vagina.  You pass blood-tinged mucus (bloody show).  You have bleeding from your vagina.  You have low back  pain that you never had before.  You feel your babys head pushing down and causing pelvic pressure.  Your baby is not moving inside you as much as it used to. Summary  Contractions that occur before labor are called Braxton Hicks contractions, false labor, or practice contractions.  Braxton Hicks contractions are usually shorter, weaker, farther apart, and less regular than true labor contractions. True labor contractions usually become progressively stronger and regular and they become more frequent.  Manage discomfort from [facility] contractions by changing position, resting in a warm bath, drinking plenty of water, or practicing deep breathing. This information is not intended to replace advice given to you by your health care provider. Make sure you discuss any questions you have with your health care  provider. Document Released: 03/22/2005 Document Revised: 02/09/2016 Document Reviewed: 02/09/2016 Elsevier Interactive Patient Education  2017 Elsevier Inc. Round Ligament Pain The round ligament is a cord of muscle and tissue that helps to support the uterus. It can become a source of pain during pregnancy if it becomes stretched or twisted as the baby grows. The pain usually begins in the second trimester of pregnancy, and it can come and go until the baby is delivered. It is not a serious problem, and it does not cause harm to the baby. Round ligament pain is usually a short, sharp, and pinching pain, but it can also be a dull, lingering, and aching pain. The pain is felt in the lower side of the abdomen or in the groin. It usually starts deep in the groin and moves up to the outside of the hip area. Pain can occur with:  A sudden change in position.  Rolling over in bed.  Coughing or sneezing.  Physical activity. Follow these instructions at home: Watch your condition for any changes. Take these steps to help with your pain:  When the pain starts, relax. Then try:  Sitting down.  Flexing your knees up to your abdomen.  Lying on your side with one pillow under your abdomen and another pillow between your legs.  Sitting in a warm bath for 15-20 minutes or until the pain goes away.  Take over-the-counter and prescription medicines only as told by your health care provider.  Move slowly when you sit and stand.  Avoid long walks if they cause pain.  Stop or lessen your physical activities if they cause pain. Contact a health care provider if:  Your pain does not go away with treatment.  You feel pain in your back that you did not have before.  Your medicine is not helping. Get help right away if:  You develop a fever or chills.  You develop uterine contractions.  You develop vaginal bleeding.  You develop nausea or vomiting.  You develop diarrhea.  You have pain  when you urinate. This information is not intended to replace advice given to you by your health care provider. Make sure you discuss any questions you have with your health care provider. Document Released: 12/30/2007 Document Revised: 08/28/2015 Document Reviewed: 05/29/2014 Elsevier Interactive Patient Education  2017 [employer].

## 2016-08-24 ENCOUNTER — Encounter (HOSPITAL_COMMUNITY): Payer: Self-pay

## 2016-08-24 LAB — OB RESULTS CONSOLE GBS: STREP GROUP B AG: NEGATIVE

## 2016-08-25 ENCOUNTER — Telehealth (HOSPITAL_COMMUNITY): Payer: Self-pay | Admitting: *Deleted

## 2016-08-25 ENCOUNTER — Encounter (HOSPITAL_COMMUNITY): Payer: Self-pay

## 2016-08-25 NOTE — Telephone Encounter (Signed)
Preadmission screen  

## 2016-08-26 NOTE — H&P (Signed)
Cathy Woodard is a 26 y.o. female presenting for repeat C/S and BTL. Pregnancy complicated by GDM on glyburide. Patient reports FBS in 90s. Also sickle trait. OB History    Gravida Para Term Preterm AB Living   5 2 2  0 2 2   SAB TAB Ectopic Multiple Live Births   1 0 1 0 2     Past Medical History:  Diagnosis Date  . Allergy   . Gestational diabetes   . Headache   . Hypercholesteremia   . No pertinent past medical history    Past Surgical History:  Procedure Laterality Date  . CESAREAN SECTION    . CESAREAN SECTION N/A 02/14/2015   Procedure: CESAREAN SECTION;  Surgeon: Harold HedgeJames Erryn Dickison, MD;  Location: [facility];  Service: Obstetrics;  Laterality: N/A;  edc 02/21/15    Family History: family history includes Bone cancer (age of onset: 5178) in her maternal grandmother; Cancer in her maternal grandfather and maternal uncle; Diabetes in her maternal grandmother. Social History:  reports that she has never smoked. She has never used smokeless tobacco. She reports that she drinks about 1.8 oz of alcohol per week . She reports that she does not use drugs.     Maternal Diabetes: Yes:  Diabetes Type:  Insulin/Medication controlled Genetic Screening: Normal Maternal Ultrasounds/Referrals: Normal Fetal Ultrasounds or other Referrals:  None Maternal Substance Abuse:  No Significant Maternal Medications:  Meds include: Other: glyburide Significant Maternal Lab Results:  None Other Comments:  None  ROS History   Last menstrual period 11/16/2015, unknown if currently breastfeeding. Maternal Exam:  Abdomen: Fetal presentation: vertex     Physical Exam  Cardiovascular: Normal rate and regular rhythm.   Respiratory: Effort normal and breath sounds normal.  GI: Soft. There is no tenderness.    Prenatal labs: ABO, Rh: B/Negative, Positive/-- (10/30 0000) Antibody: Negative (10/30 0000) Rubella: Immune, Immune (10/30 0000) RPR: Nonreactive, Nonreactive (10/30 0000)  HBsAg: Negative,  Negative (10/30 0000)  HIV: Non-reactive, Non-reactive (10/30 0000)  GBS: Negative (05/22 0000)   Assessment/Plan: 26 yo G5P2 @ 39 0/7 weeks for repeat C/S and BTL Surgery D/W patient and risks reviewed including infection, organ damage, bleeding/transfusion-HIV/Hep, DVT/PE, pneumonia. BTL D/W including alternate contraception, permanence, failure rate and increased ectopic risk. Patient states she understands and agrees.   Cathy Woodard,Cathy Woodard 08/26/2016, 2:10 PM

## 2016-08-27 ENCOUNTER — Encounter (HOSPITAL_COMMUNITY)
Admission: RE | Admit: 2016-08-27 | Discharge: 2016-08-27 | Disposition: A | Discharge status: Home / Self Care | Payer: BC Managed Care – PPO | Source: Ambulatory Visit | Attending: Obstetrics and Gynecology | Admitting: Obstetrics and Gynecology

## 2016-08-27 HISTORY — DX: Headache, unspecified: R51.9

## 2016-08-27 HISTORY — DX: Headache: R51

## 2016-08-27 LAB — COMPREHENSIVE METABOLIC PANEL
ALK PHOS: 96 U/L (ref 38–126)
ALT: 18 U/L (ref 14–54)
AST: 18 U/L (ref 15–41)
Albumin: 3 g/dL — ABNORMAL LOW (ref 3.5–5.0)
Anion gap: 6 (ref 5–15)
BILIRUBIN TOTAL: 0.5 mg/dL (ref 0.3–1.2)
BUN: 9 mg/dL (ref 6–20)
CALCIUM: 9 mg/dL (ref 8.9–10.3)
CO2: 24 mmol/L (ref 22–32)
CREATININE: 0.53 mg/dL (ref 0.44–1.00)
Chloride: 104 mmol/L (ref 101–111)
GFR calc Af Amer: >60 mL/min (ref >60)
Glucose, Bld: 101 mg/dL — ABNORMAL HIGH (ref 65–99)
Potassium: 4.1 mmol/L (ref 3.5–5.1)
Sodium: 134 mmol/L — ABNORMAL LOW (ref 135–145)
TOTAL PROTEIN: 6.2 g/dL — AB (ref 6.5–8.1)

## 2016-08-27 LAB — CBC
HEMATOCRIT: 36.8 % (ref 36.0–46.0)
HEMOGLOBIN: 12.7 g/dL (ref 12.0–15.0)
MCH: 27.8 pg (ref 26.0–34.0)
MCHC: 34.5 g/dL (ref 30.0–36.0)
MCV: 80.5 fL (ref 78.0–100.0)
Platelets: 145 10*3/uL — ABNORMAL LOW (ref 150–400)
RBC: 4.57 MIL/uL (ref 3.87–5.11)
RDW: 16.2 % — ABNORMAL HIGH (ref 11.5–15.5)
WBC: 6.9 10*3/uL (ref 4.0–10.5)

## 2016-08-27 LAB — TYPE AND SCREEN
ABO/RH(D): B POS
Antibody Screen: NEGATIVE

## 2016-08-27 NOTE — Patient Instructions (Signed)
20 Cathy Woodard  08/27/2016   Your procedure is scheduled on:  08/28/2016  [facility].  Pick up the phone at the desk and dial [phone].   Call this number if you have problems the morning of surgery: [phone]   Remember:   Do not eat food:After Midnight.  Do not drink clear liquids: After Midnight.  Take these medicines the morning of surgery with A SIP OF WATER: DO NOT TAKE GLYBURIDE ON THE MORNING OF SURGERY.  NO MEDICATIONS ON MORNING OF SURGERY   Do not wear jewelry, make-up or nail polish.  Do not wear lotions, powders, or perfumes. Do not wear deodorant.  Do not shave 48 hours prior to surgery.  Do not bring valuables to the hospital.  [facility] is not   responsible for any belongings or valuables brought to the hospital.  Contacts, dentures or bridgework may not be worn into surgery.  Leave suitcase in the car. After surgery it may be brought to your room.  For patients admitted to the hospital, checkout time is 11:00 AM the day of              discharge.   Patients discharged the day of surgery will not be allowed to drive             home.  Name and phone number of your driver: NA  Special Instructions:   N/A   Please read over the following fact sheets that you were given:   Surgical Site Infection Prevention

## 2016-08-28 ENCOUNTER — Encounter (HOSPITAL_COMMUNITY): Payer: Self-pay | Admitting: General Practice

## 2016-08-28 ENCOUNTER — Inpatient Hospital Stay (HOSPITAL_COMMUNITY): Payer: BC Managed Care – PPO

## 2016-08-28 ENCOUNTER — Inpatient Hospital Stay (HOSPITAL_COMMUNITY)
Admission: RE | Admit: 2016-08-28 | Discharge: 2016-08-30 | DRG: 765 | Disposition: A | Discharge status: Home / Self Care | Payer: BC Managed Care – PPO | Source: Ambulatory Visit | Attending: Obstetrics and Gynecology | Admitting: Obstetrics and Gynecology

## 2016-08-28 ENCOUNTER — Encounter (HOSPITAL_COMMUNITY)
Admission: RE | Disposition: A | Discharge status: Home / Self Care | Payer: Self-pay | Source: Ambulatory Visit | Attending: Obstetrics and Gynecology

## 2016-08-28 DIAGNOSIS — Z6841 Body Mass Index (BMI) 40.0 and over, adult: Secondary | ICD-10-CM

## 2016-08-28 DIAGNOSIS — Z888 Allergy status to other drugs, medicaments and biological substances status: Secondary | ICD-10-CM | POA: Diagnosis not present

## 2016-08-28 DIAGNOSIS — Z3A39 39 weeks gestation of pregnancy: Secondary | ICD-10-CM | POA: Diagnosis not present

## 2016-08-28 DIAGNOSIS — O34211 Maternal care for low transverse scar from previous cesarean delivery: Principal | ICD-10-CM | POA: Diagnosis present

## 2016-08-28 DIAGNOSIS — O9902 Anemia complicating childbirth: Secondary | ICD-10-CM | POA: Diagnosis present

## 2016-08-28 DIAGNOSIS — D573 Sickle-cell trait: Secondary | ICD-10-CM | POA: Diagnosis present

## 2016-08-28 DIAGNOSIS — O99214 Obesity complicating childbirth: Secondary | ICD-10-CM | POA: Diagnosis present

## 2016-08-28 DIAGNOSIS — O24415 Gestational diabetes mellitus in pregnancy, controlled by oral hypoglycemic drugs: Secondary | ICD-10-CM | POA: Diagnosis present

## 2016-08-28 DIAGNOSIS — Z302 Encounter for sterilization: Secondary | ICD-10-CM

## 2016-08-28 LAB — GLUCOSE, CAPILLARY: Glucose-Capillary: 106 mg/dL — ABNORMAL HIGH (ref 65–99)

## 2016-08-28 LAB — RPR: RPR Ser Ql: NONREACTIVE

## 2016-08-28 SURGERY — Surgical Case
Anesthesia: Spinal

## 2016-08-28 MED ORDER — FENTANYL CITRATE (PF) 100 MCG/2ML IJ SOLN
INTRAMUSCULAR | Status: AC
  Filled 2016-08-28: qty 2

## 2016-08-28 MED ORDER — SOD CITRATE-CITRIC ACID 500-334 MG/5ML PO SOLN
ORAL | Status: AC
  Administered 2016-08-28: 30 mL via ORAL
  Filled 2016-08-28: qty 15

## 2016-08-28 MED ORDER — PHENYLEPHRINE 8 MG IN D5W 100 ML (0.08MG/ML) PREMIX OPTIME
INJECTION | INTRAVENOUS | Status: DC | PRN
Start: 2016-08-28 — End: 2016-08-28
  Administered 2016-08-28: 60 ug/min via INTRAVENOUS

## 2016-08-28 MED ORDER — SCOPOLAMINE 1 MG/3DAYS TD PT72
MEDICATED_PATCH | TRANSDERMAL | Status: AC
  Filled 2016-08-28: qty 1

## 2016-08-28 MED ORDER — NALBUPHINE HCL 10 MG/ML IJ SOLN: 5.0000 mg | INTRAMUSCULAR | Status: DC | PRN

## 2016-08-28 MED ORDER — NALBUPHINE HCL 10 MG/ML IJ SOLN: 5.0000 mg | Freq: Once | INTRAMUSCULAR | Status: DC | PRN

## 2016-08-28 MED ORDER — METOCLOPRAMIDE HCL 5 MG/ML IJ SOLN
INTRAMUSCULAR | Status: AC
  Filled 2016-08-28: qty 2

## 2016-08-28 MED ORDER — ONDANSETRON HCL 4 MG/2ML IJ SOLN
INTRAMUSCULAR | Status: DC | PRN
  Administered 2016-08-28: 4 mg via INTRAVENOUS

## 2016-08-28 MED ORDER — ZOLPIDEM TARTRATE 5 MG PO TABS: 5.0000 mg | ORAL_TABLET | Freq: Every evening | ORAL | Status: DC | PRN

## 2016-08-28 MED ORDER — KETOROLAC TROMETHAMINE 30 MG/ML IJ SOLN
INTRAMUSCULAR | Status: AC
  Filled 2016-08-28: qty 1

## 2016-08-28 MED ORDER — OXYCODONE HCL 5 MG PO TABS
10.0000 mg | ORAL_TABLET | ORAL | Status: DC | PRN
  Administered 2016-08-30 (×2): 10 mg via ORAL
  Filled 2016-08-28 (×3): qty 2

## 2016-08-28 MED ORDER — SENNOSIDES-DOCUSATE SODIUM 8.6-50 MG PO TABS
2.0000 | ORAL_TABLET | ORAL | Status: DC
Start: 2016-08-29 — End: 2016-08-30
  Administered 2016-08-28 – 2016-08-30 (×2): 2 via ORAL
  Filled 2016-08-28 (×2): qty 2

## 2016-08-28 MED ORDER — MORPHINE SULFATE (PF) 0.5 MG/ML IJ SOLN
INTRAMUSCULAR | Status: DC | PRN
  Administered 2016-08-28: .2 mg via INTRATHECAL

## 2016-08-28 MED ORDER — FENTANYL CITRATE (PF) 100 MCG/2ML IJ SOLN
INTRAMUSCULAR | Status: DC | PRN
  Administered 2016-08-28: 20 ug via INTRATHECAL

## 2016-08-28 MED ORDER — SCOPOLAMINE 1 MG/3DAYS TD PT72
1.0000 | MEDICATED_PATCH | Freq: Once | TRANSDERMAL | Status: DC
  Filled 2016-08-28: qty 1

## 2016-08-28 MED ORDER — PROMETHAZINE HCL 25 MG/ML IJ SOLN: 6.2500 mg | INTRAMUSCULAR | Status: DC | PRN

## 2016-08-28 MED ORDER — SODIUM CHLORIDE 0.9% FLUSH: 3.0000 mL | INTRAVENOUS | Status: DC | PRN

## 2016-08-28 MED ORDER — PRENATAL MULTIVITAMIN CH
1.0000 | ORAL_TABLET | Freq: Every day | ORAL | Status: DC
  Administered 2016-08-28 – 2016-08-29 (×2): 1 via ORAL
  Filled 2016-08-28 (×2): qty 1

## 2016-08-28 MED ORDER — ERYTHROMYCIN 5 MG/GM OP OINT
TOPICAL_OINTMENT | OPHTHALMIC | Status: AC
  Filled 2016-08-28: qty 1

## 2016-08-28 MED ORDER — HYDROMORPHONE HCL 1 MG/ML IJ SOLN: 0.2500 mg | INTRAMUSCULAR | Status: DC | PRN

## 2016-08-28 MED ORDER — LACTATED RINGERS IV SOLN
INTRAVENOUS | Status: DC | PRN
  Administered 2016-08-28: 08:00:00 via INTRAVENOUS

## 2016-08-28 MED ORDER — OXYTOCIN 10 UNIT/ML IJ SOLN
INTRAMUSCULAR | Status: AC
  Filled 2016-08-28: qty 4

## 2016-08-28 MED ORDER — ACETAMINOPHEN 325 MG PO TABS
650.0000 mg | ORAL_TABLET | ORAL | Status: DC | PRN
  Administered 2016-08-29 – 2016-08-30 (×3): 650 mg via ORAL
  Filled 2016-08-28 (×4): qty 2

## 2016-08-28 MED ORDER — NALOXONE HCL 2 MG/2ML IJ SOSY
1.0000 ug/kg/h | PREFILLED_SYRINGE | INTRAVENOUS | Status: DC | PRN
  Filled 2016-08-28: qty 2

## 2016-08-28 MED ORDER — SCOPOLAMINE 1 MG/3DAYS TD PT72
MEDICATED_PATCH | TRANSDERMAL | Status: DC | PRN
  Administered 2016-08-28: 1 via TRANSDERMAL

## 2016-08-28 MED ORDER — ACETAMINOPHEN 500 MG PO TABS
1000.0000 mg | ORAL_TABLET | Freq: Four times a day (QID) | ORAL | Status: AC
  Administered 2016-08-28 – 2016-08-29 (×4): 1000 mg via ORAL
  Filled 2016-08-28 (×4): qty 2

## 2016-08-28 MED ORDER — METOCLOPRAMIDE HCL 5 MG/ML IJ SOLN
INTRAMUSCULAR | Status: DC | PRN
  Administered 2016-08-28: 10 mg via INTRAVENOUS

## 2016-08-28 MED ORDER — SOD CITRATE-CITRIC ACID 500-334 MG/5ML PO SOLN
30.0000 mL | ORAL | Status: AC
  Administered 2016-08-28: 30 mL via ORAL
  Filled 2016-08-28: qty 15

## 2016-08-28 MED ORDER — ONDANSETRON HCL 4 MG/2ML IJ SOLN
4.0000 mg | Freq: Three times a day (TID) | INTRAMUSCULAR | Status: DC | PRN
  Administered 2016-08-28: 4 mg via INTRAVENOUS
  Filled 2016-08-28: qty 2

## 2016-08-28 MED ORDER — TETANUS-DIPHTH-ACELL PERTUSSIS 5-2.5-18.5 LF-MCG/0.5 IM SUSP: 0.5000 mL | Freq: Once | INTRAMUSCULAR | Status: DC

## 2016-08-28 MED ORDER — LACTATED RINGERS IV SOLN
INTRAVENOUS | Status: DC
  Administered 2016-08-28: 13:00:00 via INTRAVENOUS

## 2016-08-28 MED ORDER — SIMETHICONE 80 MG PO CHEW
80.0000 mg | CHEWABLE_TABLET | Freq: Three times a day (TID) | ORAL | Status: DC
  Administered 2016-08-28 – 2016-08-30 (×5): 80 mg via ORAL
  Filled 2016-08-28 (×4): qty 1

## 2016-08-28 MED ORDER — SIMETHICONE 80 MG PO CHEW
80.0000 mg | CHEWABLE_TABLET | ORAL | Status: DC
  Administered 2016-08-28 – 2016-08-30 (×2): 80 mg via ORAL
  Filled 2016-08-28 (×2): qty 1

## 2016-08-28 MED ORDER — OXYCODONE HCL 5 MG PO TABS
5.0000 mg | ORAL_TABLET | ORAL | Status: DC | PRN
  Administered 2016-08-29 (×3): 5 mg via ORAL
  Filled 2016-08-28 (×3): qty 1

## 2016-08-28 MED ORDER — BUPIVACAINE IN DEXTROSE 0.75-8.25 % IT SOLN
INTRATHECAL | Status: DC | PRN
Start: 2016-08-28 — End: 2016-08-28
  Administered 2016-08-28: 1.4 mL via INTRATHECAL

## 2016-08-28 MED ORDER — ONDANSETRON HCL 4 MG/2ML IJ SOLN
INTRAMUSCULAR | Status: AC
  Filled 2016-08-28: qty 2

## 2016-08-28 MED ORDER — OXYTOCIN 40 UNITS IN LACTATED RINGERS INFUSION - SIMPLE MED: 2.5000 [IU]/h | INTRAVENOUS | Status: AC

## 2016-08-28 MED ORDER — MEPERIDINE HCL 25 MG/ML IJ SOLN: 6.2500 mg | INTRAMUSCULAR | Status: DC | PRN

## 2016-08-28 MED ORDER — DEXTROSE 5 % IV SOLN
3.0000 g | INTRAVENOUS | Status: AC
  Administered 2016-08-28: 3 g via INTRAVENOUS
  Filled 2016-08-28: qty 3000

## 2016-08-28 MED ORDER — PHENYLEPHRINE 8 MG IN D5W 100 ML (0.08MG/ML) PREMIX OPTIME
INJECTION | INTRAVENOUS | Status: AC
  Filled 2016-08-28: qty 100

## 2016-08-28 MED ORDER — DIBUCAINE 1 % RE OINT: 1.0000 "application " | TOPICAL_OINTMENT | RECTAL | Status: DC | PRN

## 2016-08-28 MED ORDER — DEXAMETHASONE SODIUM PHOSPHATE 10 MG/ML IJ SOLN
INTRAMUSCULAR | Status: AC
  Filled 2016-08-28: qty 1

## 2016-08-28 MED ORDER — LACTATED RINGERS IV SOLN
INTRAVENOUS | Status: DC
  Administered 2016-08-28 (×2): via INTRAVENOUS

## 2016-08-28 MED ORDER — LACTATED RINGERS IV SOLN
INTRAVENOUS | Status: DC | PRN
  Administered 2016-08-28: 40 [IU] via INTRAVENOUS

## 2016-08-28 MED ORDER — SODIUM CHLORIDE 0.9 % IR SOLN
Status: DC | PRN
  Administered 2016-08-28: 1000 mL

## 2016-08-28 MED ORDER — DEXAMETHASONE SODIUM PHOSPHATE 10 MG/ML IJ SOLN
INTRAMUSCULAR | Status: DC | PRN
  Administered 2016-08-28: 5 mg via INTRAVENOUS

## 2016-08-28 MED ORDER — SIMETHICONE 80 MG PO CHEW: 80.0000 mg | CHEWABLE_TABLET | ORAL | Status: DC | PRN

## 2016-08-28 MED ORDER — BUPIVACAINE IN DEXTROSE 0.75-8.25 % IT SOLN
INTRATHECAL | Status: AC
  Filled 2016-08-28: qty 4

## 2016-08-28 MED ORDER — MORPHINE SULFATE (PF) 0.5 MG/ML IJ SOLN
INTRAMUSCULAR | Status: AC
  Filled 2016-08-28: qty 10

## 2016-08-28 MED ORDER — COCONUT OIL OIL: 1.0000 "application " | TOPICAL_OIL | Status: DC | PRN

## 2016-08-28 MED ORDER — WITCH HAZEL-GLYCERIN EX PADS: 1.0000 "application " | MEDICATED_PAD | CUTANEOUS | Status: DC | PRN

## 2016-08-28 MED ORDER — MENTHOL 3 MG MT LOZG: 1.0000 | LOZENGE | OROMUCOSAL | Status: DC | PRN

## 2016-08-28 MED ORDER — NALOXONE HCL 0.4 MG/ML IJ SOLN: 0.4000 mg | INTRAMUSCULAR | Status: DC | PRN

## 2016-08-28 SURGICAL SUPPLY — 43 items
ADH SKN CLS APL DERMABOND .7 (GAUZE/BANDAGES/DRESSINGS)
APL SKNCLS STERI-STRIP NONHPOA (GAUZE/BANDAGES/DRESSINGS) ×1
BENZOIN TINCTURE PRP APPL 2/3 (GAUZE/BANDAGES/DRESSINGS) ×2 IMPLANT
CLAMP CORD UMBIL (MISCELLANEOUS) IMPLANT
CLOSURE WOUND 1/2 X4 (GAUZE/BANDAGES/DRESSINGS) ×1
CLOTH BEACON ORANGE TIMEOUT ST (SAFETY) ×3 IMPLANT
CONTAINER PREFILL 10% NBF 15ML (MISCELLANEOUS) IMPLANT
DERMABOND ADVANCED (GAUZE/BANDAGES/DRESSINGS)
DERMABOND ADVANCED .7 DNX12 (GAUZE/BANDAGES/DRESSINGS) IMPLANT
DRSG OPSITE POSTOP 4X10 (GAUZE/BANDAGES/DRESSINGS) ×3 IMPLANT
DURAPREP 26ML APPLICATOR (WOUND CARE) ×3 IMPLANT
ELECT REM PT RETURN 9FT ADLT (ELECTROSURGICAL) ×3
ELECTRODE REM PT RTRN 9FT ADLT (ELECTROSURGICAL) ×1 IMPLANT
EXTRACTOR VACUUM M CUP 4 TUBE (SUCTIONS) ×1 IMPLANT
EXTRACTOR VACUUM M CUP 4' TUBE (SUCTIONS) ×1
GAUZE SPONGE 4X4 12PLY STRL LF (GAUZE/BANDAGES/DRESSINGS) ×4 IMPLANT
GLOVE BIO SURGEON STRL SZ8 (GLOVE) ×3 IMPLANT
GLOVE BIOGEL PI IND STRL 7.0 (GLOVE) ×1 IMPLANT
GLOVE BIOGEL PI INDICATOR 7.0 (GLOVE) ×2
GOWN STRL REUS W/TWL LRG LVL3 (GOWN DISPOSABLE) ×6 IMPLANT
KIT ABG SYR 3ML LUER SLIP (SYRINGE) ×3 IMPLANT
NDL HYPO 25X5/8 SAFETYGLIDE (NEEDLE) ×1 IMPLANT
NEEDLE HYPO 22GX1.5 SAFETY (NEEDLE) ×3 IMPLANT
NEEDLE HYPO 25X5/8 SAFETYGLIDE (NEEDLE) ×3 IMPLANT
NS IRRIG 1000ML POUR BTL (IV SOLUTION) ×3 IMPLANT
PACK C SECTION WH (CUSTOM PROCEDURE TRAY) ×3 IMPLANT
PAD ABD 8X7 1/2 STERILE (GAUZE/BANDAGES/DRESSINGS) ×2 IMPLANT
PAD OB MATERNITY 4.3X12.25 (PERSONAL CARE ITEMS) ×3 IMPLANT
PENCIL SMOKE EVAC W/HOLSTER (ELECTROSURGICAL) ×3 IMPLANT
RETRACTOR TRAXI PANNICULUS (MISCELLANEOUS) IMPLANT
STRIP CLOSURE SKIN 1/2X4 (GAUZE/BANDAGES/DRESSINGS) ×1 IMPLANT
SUT MNCRL 0 VIOLET CTX 36 (SUTURE) ×4 IMPLANT
SUT MONOCRYL 0 CTX 36 (SUTURE) ×8
SUT PDS AB 0 CTX 60 (SUTURE) ×3 IMPLANT
SUT PLAIN 0 NONE (SUTURE) IMPLANT
SUT PLAIN 2 0 (SUTURE)
SUT PLAIN 2 0 XLH (SUTURE) ×3 IMPLANT
SUT PLAIN ABS 2-0 CT1 27XMFL (SUTURE) IMPLANT
SUT VIC AB 4-0 KS 27 (SUTURE) ×3 IMPLANT
TAPE CLOTH SURG 4X10 WHT LF (GAUZE/BANDAGES/DRESSINGS) ×2 IMPLANT
TOWEL OR 17X24 6PK STRL BLUE (TOWEL DISPOSABLE) ×3 IMPLANT
TRAXI PANNICULUS RETRACTOR (MISCELLANEOUS) ×2
TRAY FOLEY BAG SILVER LF 14FR (SET/KITS/TRAYS/PACK) ×3 IMPLANT

## 2016-08-28 NOTE — Anesthesia Preprocedure Evaluation (Signed)
Anesthesia Evaluation  Patient identified by MRN, date of birth, ID band Patient awake    Reviewed: Allergy & Precautions, H&P , NPO status , Patient's Chart, lab work & pertinent test results  Airway Mallampati: II  TM Distance: >3 FB Neck ROM: full    Dental no notable dental hx.    Pulmonary neg pulmonary ROS,    Pulmonary exam normal breath sounds clear to auscultation       Cardiovascular negative cardio ROS Normal cardiovascular exam Rhythm:regular Rate:Normal     Neuro/Psych negative psych ROS   GI/Hepatic negative GI ROS, Neg liver ROS,   Endo/Other  diabetesMorbid obesity  Renal/GU negative Renal ROS     Musculoskeletal negative musculoskeletal ROS (+)   Abdominal (+) + obese,   Peds  Hematology negative hematology ROS (+)   Anesthesia Other Findings   Reproductive/Obstetrics (+) Pregnancy                             Anesthesia Physical Anesthesia Plan  ASA: III  Anesthesia Plan: Spinal   Post-op Pain Management:    Induction:   Airway Management Planned:   Additional Equipment:   Intra-op Plan:   Post-operative Plan:   Informed Consent: I have reviewed the patients History and Physical, chart, labs and discussed the procedure including the risks, benefits and alternatives for the proposed anesthesia with the patient or authorized representative who has indicated his/her understanding and acceptance.     Plan Discussed with: CRNA and Surgeon  Anesthesia Plan Comments:         Anesthesia Quick Evaluation

## 2016-08-28 NOTE — Op Note (Signed)
[name]:  Doublin, Elanor             ACCOUNT NO.:  [id number]  MEDICAL RECORD NO.:  [id number]  LOCATION:                                 FACILITY:  PHYSICIAN:  Guy SandiferJames E. Henderson Cloudomblin, M.D. DATE OF BIRTH:  14-Jan-1991  DATE OF PROCEDURE:  08/28/2016 DATE OF DISCHARGE:                              OPERATIVE REPORT   [facility]  [facility] [place].  PREOPERATIVE DIAGNOSES: 1. Previous cesarean section, desires repeat. 2. Desires permanent sterilization.  POSTOPERATIVE DIAGNOSES: 1. Previous cesarean section, desires repeat. 2. Desires permanent sterilization.  PROCEDURE:  Repeat low transverse cesarean section and bilateral tubal ligation.  SURGEON:  Guy SandiferJames E. Henderson Cloudomblin, MD.  ANESTHESIA:  Spinal, Joanette GulaJohn Franklin Hatchett, MD.  ESTIMATED BLOOD LOSS:  600 mL.  SPECIMENS: 1. Placenta. 2. Bilateral fallopian tube segments to Pathology.  FINDINGS:  Viable female infant.  Apgars, arterial cord pH, birth weight pending.  INDICATIONS AND CONSENT:  This patient is a 26 year old multiparous female, who desires repeat cesarean section and permanent sterilization. Pregnancy has been complicated by gestational diabetes on glyburide. Potential risks and complications were reviewed preoperatively including, but not limited to infection, organ damage, bleeding requiring transfusion of blood products with HIV and hepatitis acquisition, DVT, PE, pneumonia.  Tubal ligation.  Alternate methods of contraception, permanence, failure rate, and increased ectopic risk have also been reviewed.  The patient states she understands and agrees. Consent was signed on the chart.  DESCRIPTION OF PROCEDURE:  The patient was taken to the operating room, where she was identified and spinal anesthetic was placed per Dr. Arby BarretteHatchett.  She was then placed in a dorsal supine position with a 15- degree left lateral wedge.  Foley catheter was placed.  She was prepped vaginally and  abdominally.  After 3-minute drying time, she was draped in a sterile fashion.  Time-out was undertaken.  After testing for adequate spinal anesthesia, skin was entered through the previous scar and dissection was carried out in layers to the peritoneum.  Peritoneum was incised, extended superiorly and inferiorly.  Vesicouterine peritoneum was taken down cephalad laterally.  Bladder flap developed, and the bladder blade was placed.  Uterus was incised in a low transverse manner and the uterine cavity was entered bluntly with a hemostat.  Uterine incision was extended cephalad laterally with fingers.  Clear fluid was noted.  The vertex was delivered in an order to get it through the incision.  The vacuum extractor was used to gently elevate the vertex.  The remainder of baby was delivered.  Cord was clamped and cut after 1 minute wait, and baby was handed to awaiting pediatrics team.  Placenta was manually delivered, sent to Pathology. Uterine cavity was clean.  Uterus was closed in 2 running locking imbricating layers of 0 Monocryl suture, which achieves good hemostasis. Tubes and ovaries were normal bilaterally.  Left fallopian tube was identified from cornu to fimbria.  It was grasped in its mid ampullary portion with Babcock clamp.  A knuckle of tube was then doubly ligated with 2 free ties of plain suture.  The intervening knuckle was then sharply resected.  Cautery was used to assure complete hemostasis. Similar procedure was carried out on the  right side.  Lavage was carried out.  Anterior peritoneum was closed in a running fashion with 0 Monocryl suture, which was also used to reapproximate the pyramidalis muscle in the midline.  The anterior rectus fascia was closed in a running fashion with a 0 looped PDS suture.  Subcutaneous layer was closed with interrupted plain and the skin was closed in a subcuticular manner with a 4-0 Vicryl on a Keith needle.  Benzoin,  Steri-Strips, pressure dressing were applied.  All counts were correct.  The patient was taken to recovery room in stable condition.     Guy Sandifer Henderson Cloud, M.D.   ______________________________ Guy Sandifer. Henderson Cloud, M.D.    JET/MEDQ  D:  08/28/2016  T:  08/28/2016  Job:  [id number]

## 2016-08-28 NOTE — Brief Op Note (Signed)
08/28/2016  8:43 AM  PATIENT:  Randel PiggNataya S Kincannon  26 y.o. female  PRE-OPERATIVE DIAGNOSIS:  previous X 2, DESIRES STERILITY  POST-OPERATIVE DIAGNOSIS:  previous X 2, DESIRES STERILITY  PROCEDURE:  Procedure(s) with comments: CESAREAN SECTION WITH BILATERAL TUBAL LIGATION (N/A) - Repeat edc 09/04/16 allergic to benadryl and naproxen need RNFA  SURGEON:  Surgeon(s) and Role:    * Harold Hedgeomblin, Mylik Pro, MD - Primary  PHYSICIAN ASSISTANT:   ASSISTANTS: none   ANESTHESIA:   spinal  EBL:  Total I/O In: 2200 [I.V.:2200] Out: 800 [Urine:200; Blood:600]  BLOOD ADMINISTERED:none  DRAINS: Urinary Catheter (Foley)   LOCAL MEDICATIONS USED:  NONE  SPECIMEN:  Source of Specimen:  placenta, bilateral fallopian tube segments  DISPOSITION OF SPECIMEN:  PATHOLOGY  COUNTS:  YES  TOURNIQUET:  * No tourniquets in log *  DICTATION: .Other Dictation: Dictation Number [id number]  PLAN OF CARE: Admit to inpatient   PATIENT DISPOSITION:  PACU - hemodynamically stable.   Delay start of Pharmacological VTE agent (>24hrs) due to surgical blood loss or risk of bleeding: not applicable

## 2016-08-28 NOTE — Addendum Note (Signed)
Addendum  created 08/28/16 1555 by Opha Mcghee H, CRNA   Sign clinical note    

## 2016-08-28 NOTE — Anesthesia Postprocedure Evaluation (Signed)
Anesthesia Post Note  Patient: Cathy Woodard  Procedure(s) Performed: Procedure(s) (LRB): CESAREAN SECTION WITH BILATERAL TUBAL LIGATION (N/A)  Patient location during evaluation: PACU Anesthesia Type: Spinal Level of consciousness: awake Pain management: pain level controlled Vital Signs Assessment: post-procedure vital signs reviewed and stable Respiratory status: spontaneous breathing Cardiovascular status: stable Postop Assessment: no headache, no backache, spinal receding, patient able to bend at knees and no signs of nausea or vomiting Anesthetic complications: no        Last Vitals:  Vitals:   08/28/16 0945 08/28/16 1000  BP: 105/66   Pulse: 71   Resp: 20 16  Temp:      Last Pain:  Vitals:   08/28/16 0915  TempSrc:   PainSc: 0-No pain   Pain Goal:                 Oran Dillenburg JR,JOHN Ringo Sherod

## 2016-08-28 NOTE — Lactation Note (Signed)
This note was copied from a baby's chart. Lactation Consultation Note  P3, Baby 6 hours old.  Mother has been breastfeeding. Mother denies questions or needing help w/ breastfeeding. Mom encouraged to feed baby 8-12 times/24 hours and with feeding cues.  Mom made aware of O/P services, breastfeeding support groups, community resources, and our phone # for post-discharge questions.    Patient Name: Cathy Woodard AVWUJ'WToday's Date: 08/28/2016     Maternal Data    Feeding Feeding Type: Formula Nipple Type: Slow - flow Length of feed: 0 min  LATCH Score/Interventions                      Lactation Tools Discussed/Used     Consult Status      Hardie PulleyBerkelhammer, Ruth Boschen 08/28/2016, 3:14 PM

## 2016-08-28 NOTE — Anesthesia Postprocedure Evaluation (Addendum)
Anesthesia Post Note  Patient: Cathy Woodard  Procedure(s) Performed: Procedure(s) (LRB): CESAREAN SECTION WITH BILATERAL TUBAL LIGATION (N/A)  Patient location during evaluation: Mother Baby Anesthesia Type: Spinal Level of consciousness: awake and alert and oriented Pain management: pain level controlled Vital Signs Assessment: post-procedure vital signs reviewed and stable Respiratory status: spontaneous breathing and nonlabored ventilation Cardiovascular status: stable Postop Assessment: no headache, patient able to bend at knees, no backache, no signs of nausea or vomiting, spinal receding and adequate PO intake Anesthetic complications: no        Last Vitals:  Vitals:   08/28/16 1326 08/28/16 1430  BP: 108/60 97/60  Pulse: 75 77  Resp: 18 18  Temp: 36.3 C 36.2 C    Last Pain:  Vitals:   08/28/16 1430  TempSrc: Axillary  PainSc:    Pain Goal:                 Land O'LakesMalinova,Nataliya Hristova

## 2016-08-28 NOTE — Addendum Note (Signed)
Addendum  created 08/28/16 1019 by Leilani AbleHatchett, Alesa Echevarria, MD   Order sets accessed

## 2016-08-28 NOTE — Consult Note (Signed)
[facility]  Delivery Note:  C-section       08/28/2016  7:34 AM  I was called to the operating room at the request of the patient's obstetrician (Dr. Tomblin) for a repeat c-section.  PRENATAL HX:  This is a 25 y/o G5P2022 at 39 and 0/[redacted] weeks gestation who presents for elective repeat c-section.  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  She also has sickle cell trait.    INTRAPARTUM HX:   Repeat c-section with AROM at delivery.   DELIVERY:  Vacuum attempted, not ultimately used for extraction.  Cord clamping delayed for 60 seconds.  Infant was vigorous at delivery, requiring no resuscitation other than standard warming, drying and stimulation.  APGARs 8 and 9.  Exam within normal limits.  After 5 minutes, baby left with nurse to assist parents with skin-to-skin care.   _____________________ Electronically Signed By: Mackinley Cassaday, MD Neonatologist  

## 2016-08-28 NOTE — Op Note (Deleted)
  The note originally documented on this encounter has been moved the the encounter in which it belongs.  

## 2016-08-28 NOTE — Transfer of Care (Signed)
Immediate Anesthesia Transfer of Care Note  Patient: Cathy Woodard S Sipos  Procedure(s) Performed: Procedure(s) with comments: CESAREAN SECTION WITH BILATERAL TUBAL LIGATION (N/A) - Repeat edc 09/04/16 allergic to benadryl and naproxen need RNFA  Patient Location: PACU  Anesthesia Type:Spinal  Level of Consciousness: awake, alert  and oriented  Airway & Oxygen Therapy: Patient Spontanous Breathing  Post-op Assessment: Report given to RN and Post -op Vital signs reviewed and stable  Post vital signs: Reviewed and stable  Last Vitals:  Vitals:   08/28/16 0538  BP: 113/69  Pulse: 90  Resp: 18  Temp: 36.6 C    Last Pain:  Vitals:   08/28/16 0538  TempSrc: Oral  PainSc: 0-No pain         Complications: No apparent anesthesia complications

## 2016-08-28 NOTE — Progress Notes (Signed)
No changes to H&P per patient history Reviewed with patient procedure-C/S with BTL All questions answered Patient states she understands and agrees

## 2016-08-28 NOTE — Anesthesia Procedure Notes (Signed)
Spinal  Patient location during procedure: OR Start time: 08/28/2016 7:45 AM End time: 08/28/2016 7:48 AM Staffing Anesthesiologist: Leilani AbleHATCHETT, Mayling Aber Performed: anesthesiologist  Preanesthetic Checklist Completed: patient identified, surgical consent, pre-op evaluation, timeout performed, IV checked, risks and benefits discussed and monitors and equipment checked Spinal Block Patient position: sitting Prep: site prepped and draped and DuraPrep Patient monitoring: heart rate, cardiac monitor, continuous pulse ox and blood pressure Approach: midline Location: L3-4 Injection technique: single-shot Needle Needle type: Pencan  Needle gauge: 24 G Needle length: 9 cm Needle insertion depth: 7 cm Assessment Sensory level: T4

## 2016-08-29 LAB — CBC
HCT: 32.7 % — ABNORMAL LOW (ref 36.0–46.0)
HEMOGLOBIN: 11.3 g/dL — AB (ref 12.0–15.0)
MCH: 27.9 pg (ref 26.0–34.0)
MCHC: 34.6 g/dL (ref 30.0–36.0)
MCV: 80.7 fL (ref 78.0–100.0)
Platelets: 147 10*3/uL — ABNORMAL LOW (ref 150–400)
RBC: 4.05 MIL/uL (ref 3.87–5.11)
RDW: 16.2 % — ABNORMAL HIGH (ref 11.5–15.5)
WBC: 12.8 10*3/uL — ABNORMAL HIGH (ref 4.0–10.5)

## 2016-08-29 NOTE — Progress Notes (Signed)
Subjective: Postpartum Day 1: Cesarean Delivery Patient reports tolerating PO and no problems voiding.    Objective: Vital signs in last 24 hours: Temp:  [96.4 F (35.8 C)-98.6 F (37 C)] 98.1 F (36.7 C) (05/27 0804) Pulse Rate:  [67-81] 68 (05/27 0804) Resp:  [13-22] 18 (05/27 0804) BP: (94-112)/(43-82) 98/57 (05/27 0804) SpO2:  [92 %-99 %] 96 % (05/27 0612)  Physical Exam:  General: alert, cooperative and no distress Lochia: appropriate Uterine Fundus: firm Incision: healing well DVT Evaluation: No evidence of DVT seen on physical exam.   Recent Labs  08/27/16 1130 08/29/16 0505  HGB 12.7 11.3*  HCT 36.8 32.7*    Assessment/Plan: Status post Cesarean section. Doing well postoperatively.  Continue current care Fasting glucose in am.  Cathy Woodard,Wash Nienhaus E 08/29/2016, 8:44 AM

## 2016-08-29 NOTE — Lactation Note (Signed)
This note was copied from a baby's chart. Lactation Consultation Note  Mother states she knows how to hand express.  She feels "her milk is not in yet" and plans to breastfeed once home. Provided education.  Discussed supply and demand and bf before offering formula to help establish her milk supply.  Patient Name: Boy Concha Norwayataya Ramos ZOXWR'UToday's Date: 08/29/2016 Reason for consult: Follow-up assessment   Maternal Data    Feeding    LATCH Score/Interventions                      Lactation Tools Discussed/Used     Consult Status Consult Status: PRN    Hardie PulleyBerkelhammer, Ruth Boschen 08/29/2016, 2:43 PM

## 2016-08-30 LAB — GLUCOSE, CAPILLARY: GLUCOSE-CAPILLARY: 97 mg/dL (ref 65–99)

## 2016-08-30 MED ORDER — OXYCODONE HCL 5 MG PO TABS: 5.0000 mg | ORAL_TABLET | ORAL | 0 refills | Status: DC | PRN

## 2016-08-30 MED ORDER — DOCUSATE SODIUM 100 MG PO CAPS: 100.0000 mg | ORAL_CAPSULE | Freq: Two times a day (BID) | ORAL | 2 refills | Status: DC

## 2016-08-30 MED ORDER — FERROUS SULFATE 325 (65 FE) MG PO TBEC: 325.0000 mg | DELAYED_RELEASE_TABLET | Freq: Two times a day (BID) | ORAL | 2 refills | Status: DC

## 2016-08-30 NOTE — Discharge Summary (Addendum)
Obstetric Discharge Summary Reason for Admission: cesarean section Prenatal Procedures: none Intrapartum Procedures: cesarean: low cervical, transverse and tubal ligation Postpartum Procedures: none Complications-Operative and Postpartum: none Hemoglobin  Date Value Ref Range Status  08/29/2016 11.3 (L) 12.0 - 15.0 g/dL Final   Hemoglobin, fingerstick  Date Value Ref Range Status  07/10/2013 12.1 12.0 - 16.0 g/dL Final   HCT  Date Value Ref Range Status  08/29/2016 32.7 (L) 36.0 - 46.0 % Final    Physical Exam:  General: alert, cooperative and appears stated age 69Lochia: appropriate Uterine Fundus: firm Incision: healing well, no significant drainage, no dehiscence, no significant erythema DVT Evaluation: No evidence of DVT seen on physical exam. Negative Homan's sign. No cords or calf tenderness. No significant calf/ankle edema.  Discharge Diagnoses: Term Pregnancy-delivered  Discharge Information: Date: 08/30/2016 Activity: pelvic rest Diet: routine Medications: Colace, Iron and Percocet Condition: stable Instructions: refer to practice specific booklet Discharge to: home   Newborn Data: Live born female  Birth Weight: 9 lb 14.7 oz (4500 g) APGAR: 8, 9  Home with mother.   Ranae Pilalise Jennifer Leger 08/30/2016, 9:13 AM

## 2016-08-31 LAB — GLUCOSE, CAPILLARY: Glucose-Capillary: 121 mg/dL — ABNORMAL HIGH (ref 65–99)

## 2016-09-10 NOTE — Addendum Note (Signed)
Addendum  created 09/10/16 1255 by Tabius Rood, MD   Sign clinical note    

## 2017-06-03 ENCOUNTER — Ambulatory Visit: Payer: 59 | Admitting: Emergency Medicine

## 2018-10-10 IMAGING — US US OB COMP +14 WK
1 series · 14 of 24 positions shown · non-contrast
Comparison: none

CLINICAL DATA: Fell tonight.

EXAM:
LIMITED OBSTETRIC ULTRASOUND

[Series 1: us ob comp +14 wk · 0.31mm/px · 24 acquisitions, 14 frames shown]
[im 1/24]
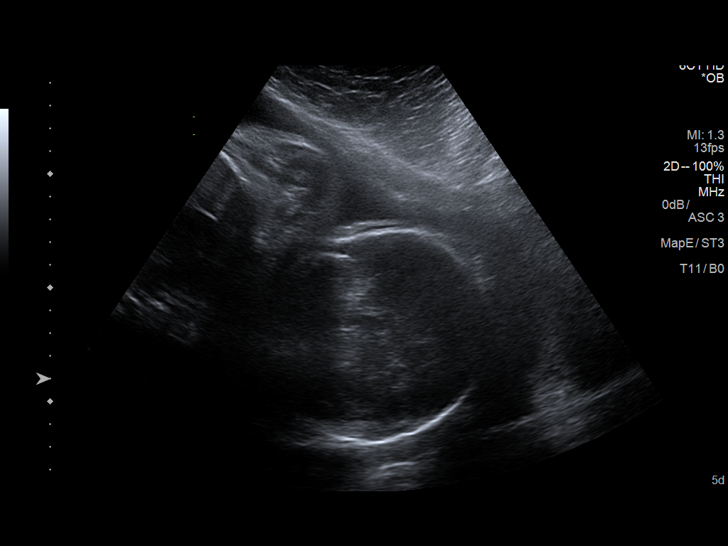
[im 3/24]
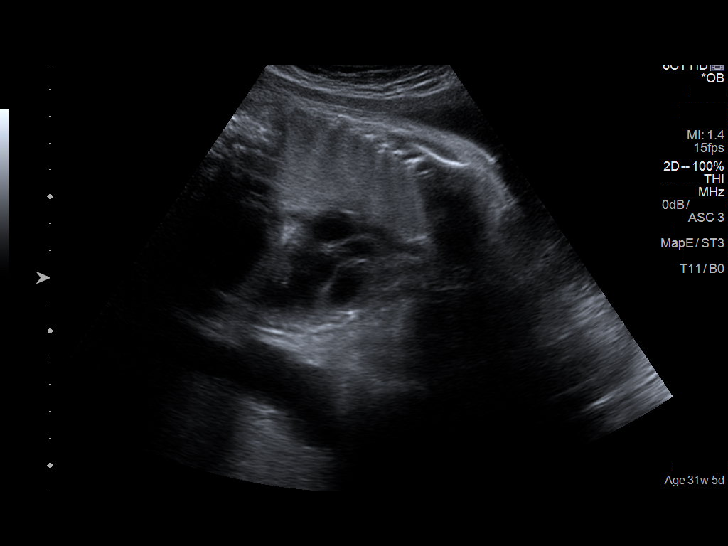
[im 5/24]
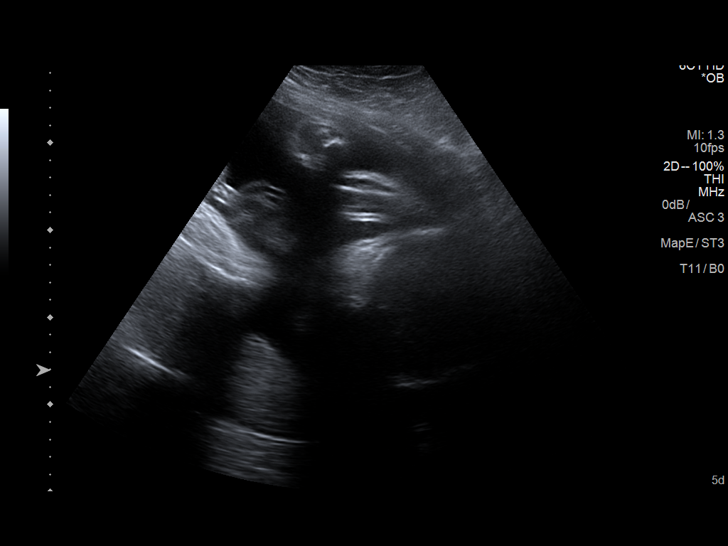
[im 7/24]
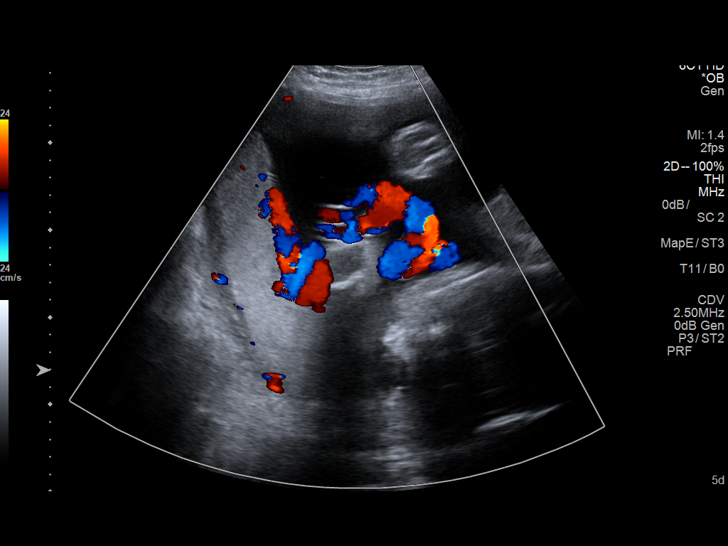
[im 8/24]
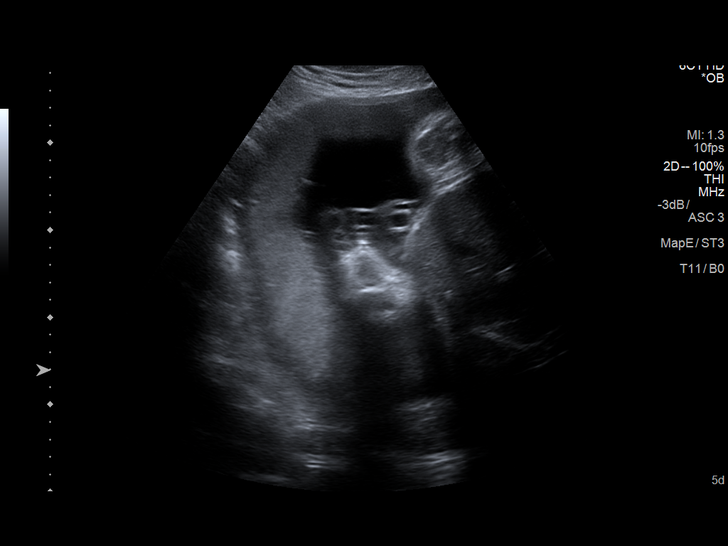
[im 10/24]
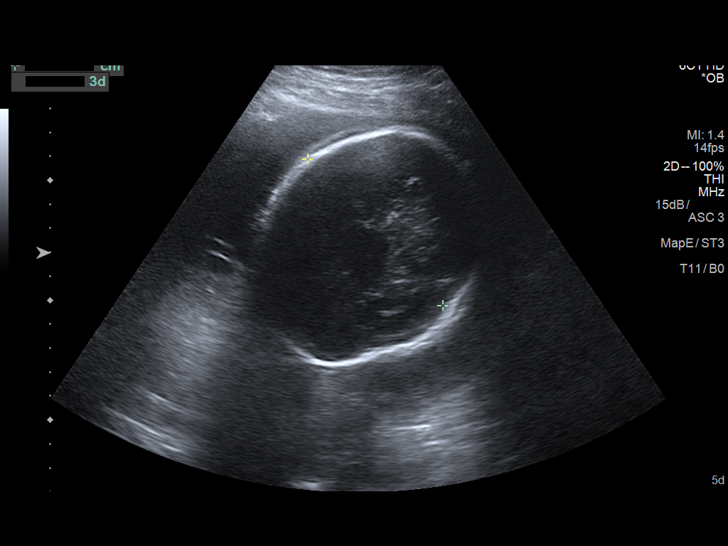
[im 12/24]
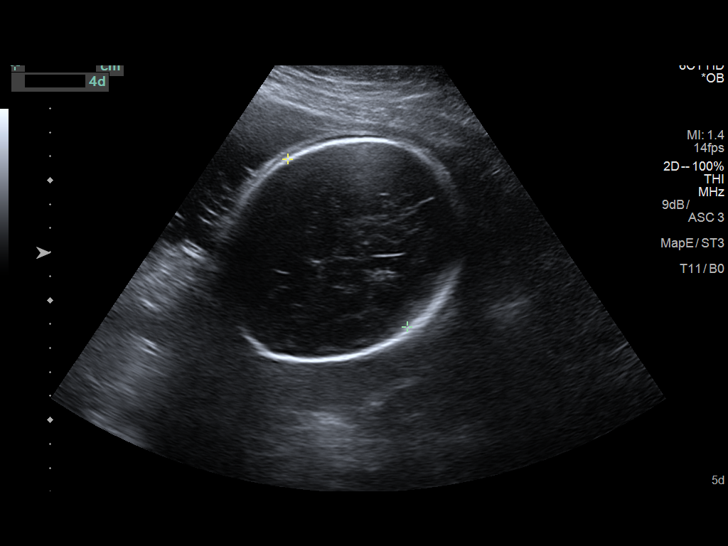
[im 13/24]
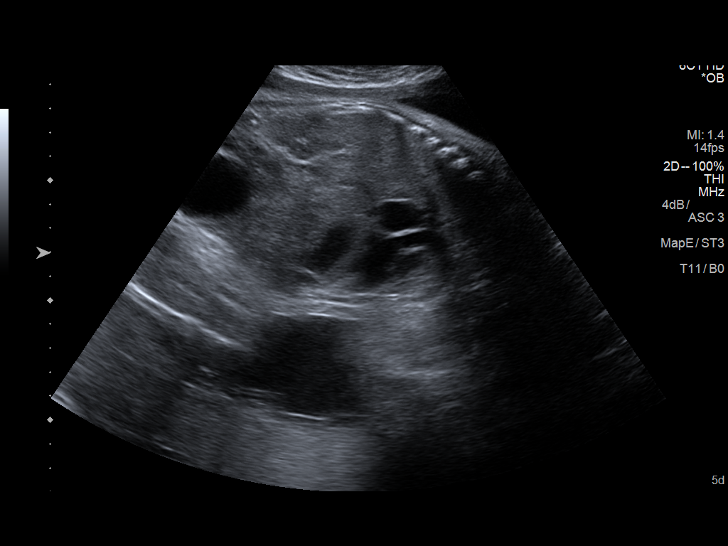
[im 15/24]
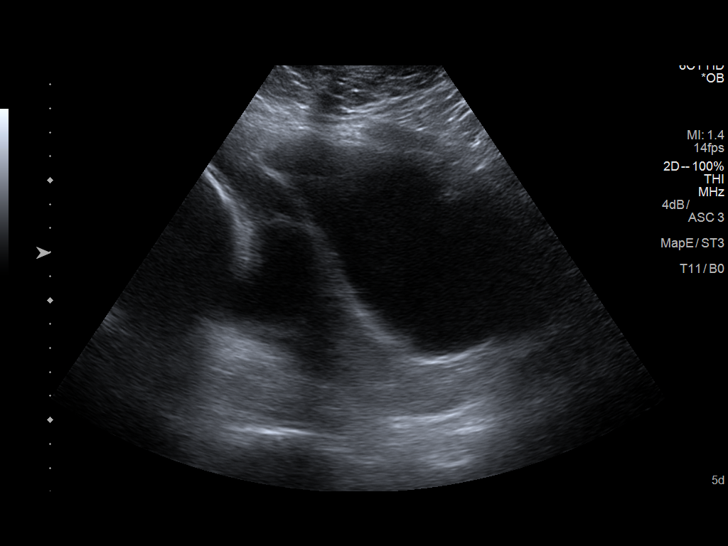
[im 17/24]
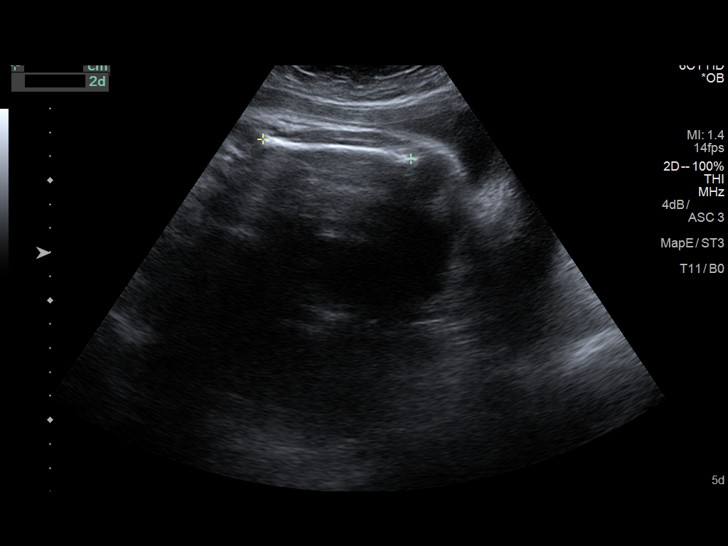
[im 19/24]
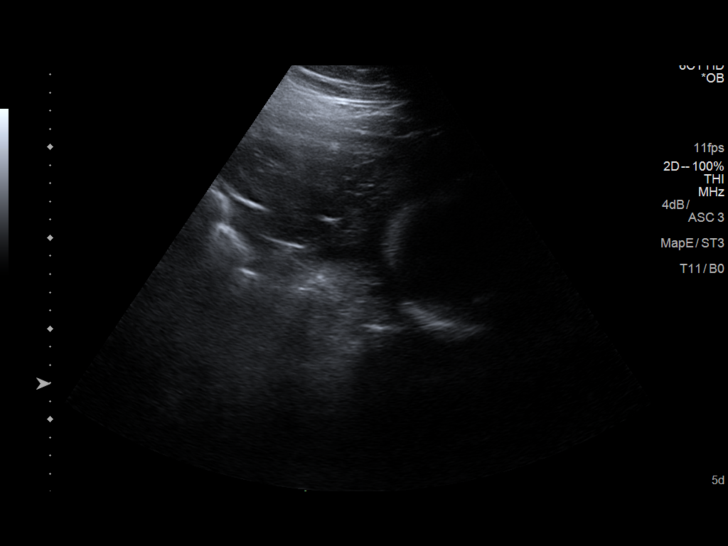
[im 20/24]
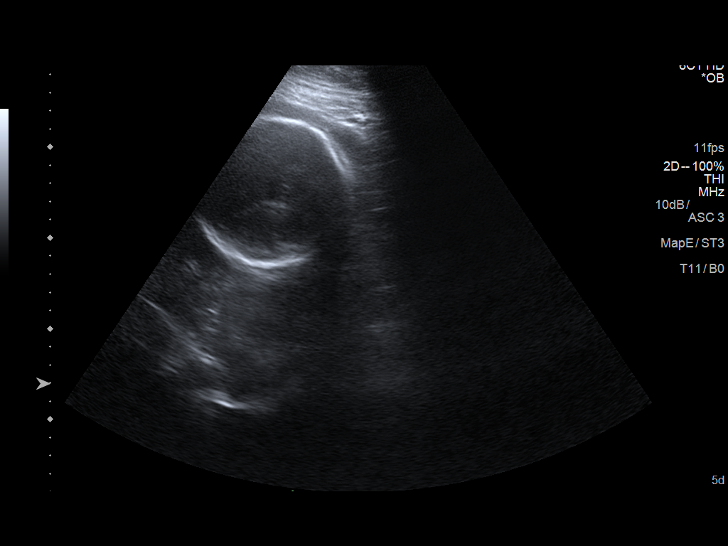
[im 22/24]
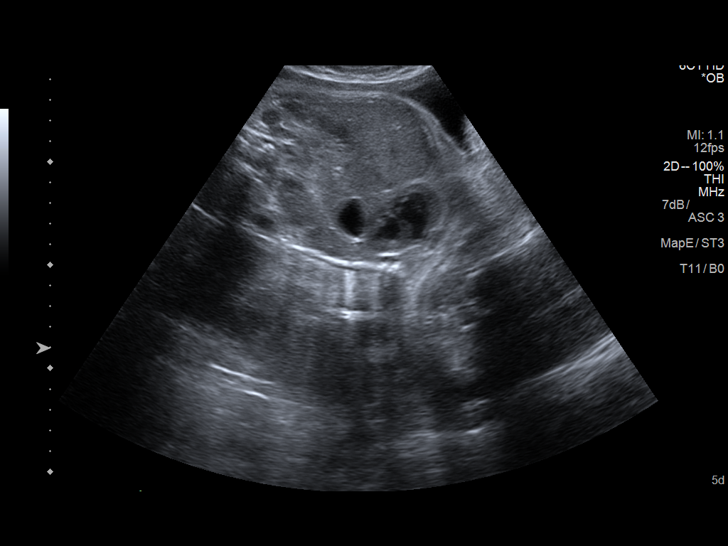
[im 24/24]
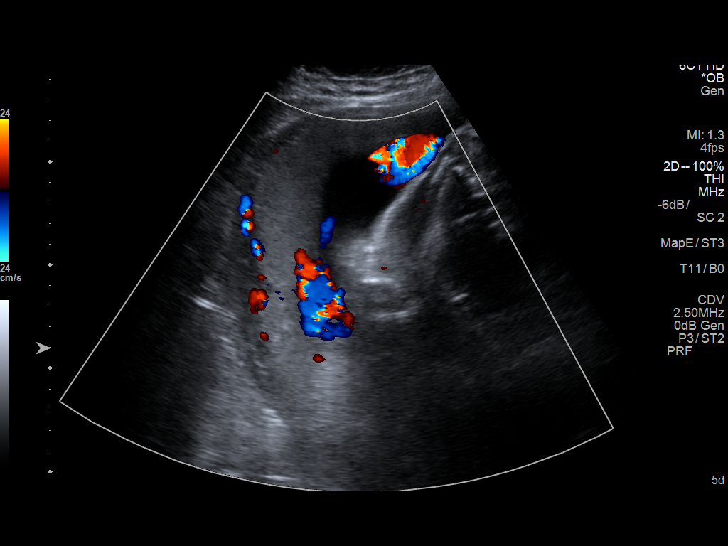

[14 of 24 positions shown; findings below may reference images not displayed]

FINDINGS: Number of Fetuses: 1

Heart Rate:  127 bpm

Movement: Visible

Presentation: Cephalic

Placental Location: Posterior

Previa: No

Amniotic Fluid (Subjective):  Within normal limits.

BPD:  8.38cm 33w  5d

MATERNAL FINDINGS:

Cervix:  Appears closed.

Uterus/Adnexae:  No abnormality visualized.
IMPRESSION: Single living intrauterine gestation. Normal fluid volume. Cephalic
position. Placenta appears intact, without previa.

This exam is performed on an emergent basis and does not
comprehensively evaluate fetal size, dating, or anatomy; follow-up
complete OB US should be considered if further fetal assessment is
warranted.

## 2019-07-06 ENCOUNTER — Telehealth (HOSPITAL_COMMUNITY): Payer: Self-pay | Admitting: Nurse Practitioner

## 2019-07-06 NOTE — Telephone Encounter (Signed)
Called to Discuss with patient about Covid symptoms and the use of bamlanivimab, a monoclonal antibody infusion for those with mild to moderate Covid symptoms and at a high risk of hospitalization.     Unclear if patient qualifies for infusion but would like to screen her for treatment at request of her mother. Last BMI from 2018.   Left message.   Consuello Masse, DNP, AGNP-C [phone] (Infusion Center Hotline)

## 2022-04-12 ENCOUNTER — Ambulatory Visit: Payer: 59 | Admitting: Family Medicine

## 2022-04-22 ENCOUNTER — Ambulatory Visit: Payer: 59 | Admitting: Family Medicine

## 2022-04-22 ENCOUNTER — Encounter: Payer: Self-pay | Admitting: Family Medicine

## 2022-04-22 VITALS — BP 108/75 | HR 73 | Temp 98.1°F | Resp 16 | Ht 64.0 in | Wt 191.0 lb

## 2022-04-22 DIAGNOSIS — Z8632 Personal history of gestational diabetes: Secondary | ICD-10-CM | POA: Diagnosis not present

## 2022-04-22 DIAGNOSIS — E559 Vitamin D deficiency, unspecified: Secondary | ICD-10-CM | POA: Diagnosis not present

## 2022-04-22 DIAGNOSIS — Z8639 Personal history of other endocrine, nutritional and metabolic disease: Secondary | ICD-10-CM | POA: Diagnosis not present

## 2022-04-22 DIAGNOSIS — Z6832 Body mass index (BMI) 32.0-32.9, adult: Secondary | ICD-10-CM

## 2022-04-22 DIAGNOSIS — E66811 Obesity, class 1: Secondary | ICD-10-CM

## 2022-04-22 DIAGNOSIS — E611 Iron deficiency: Secondary | ICD-10-CM | POA: Insufficient documentation

## 2022-04-22 DIAGNOSIS — E669 Obesity, unspecified: Secondary | ICD-10-CM | POA: Diagnosis not present

## 2022-04-22 DIAGNOSIS — L509 Urticaria, unspecified: Secondary | ICD-10-CM | POA: Diagnosis not present

## 2022-04-22 NOTE — Assessment & Plan Note (Signed)
No current supplementation other than daily multivitamin Labs today

## 2022-04-22 NOTE — Assessment & Plan Note (Signed)
Labs today Encourage healthy diet choices like Mediterranean diet, portion control, regular exercise

## 2022-04-22 NOTE — Patient Instructions (Signed)
Recommend trying Zyrtec once a day to see if that decreases rash frequency or severity.  Referral to allergist Updating labs today    ---------------------   Thank you for choosing [facility] at [facility] for your Primary Care needs. I am excited for the opportunity to partner with you to meet your health care goals. It was a pleasure meeting you today!  Information on diet, exercise, and health maintenance recommendations are listed below. This is information to help you be sure you are on track for optimal health and monitoring.   Please look over this and let us know if you have any questions or if you have completed any of the health maintenance outside of [facility] so that we can be sure your records are up to date.  ___________________________________________________________  MyChart:  For all urgent or time sensitive needs we ask that you please call the office to avoid delays. Our number is [phone]. MyChart is not constantly monitored and due to the large volume of messages a day, replies may take up to 72 business hours.  MyChart Policy: MyChart allows for you to see your visit notes, after visit summary, provider recommendations, lab and tests results, make an appointment, request refills, and contact your provider or the office for non-urgent questions or concerns. Providers are seeing patients during normal business hours and do not have built in time to review MyChart messages.  We ask that you allow a minimum of 3 business days for responses to [employer]. For this reason, please do not send urgent requests through [place]. Please call the office at [phone]. New and ongoing conditions may require a visit. We have virtual and in-person visits available for your convenience.  Complex MyChart concerns may require a visit. Your provider may request you schedule a virtual or in-person visit to ensure we are providing the best care  possible. MyChart messages sent after 11:00 AM on Friday will not be received by the provider until Monday morning.    Lab and Test Results: You will receive your lab and test results on MyChart as soon as they are completed and results have been sent by the lab or testing facility. Due to this service, you will receive your results BEFORE your provider.  I review lab and test results each morning prior to seeing patients. Some results require collaboration with other providers to ensure you are receiving the most appropriate care. For this reason, we ask that you please allow a minimum of 3-5 business days from the time that ALL results have been received for your provider to receive and review lab and test results and contact you about these.  Most lab and test result comments from the provider will be sent through [facility]. Your provider may recommend changes to the plan of care, follow-up visits, repeat testing, ask questions, or request an office visit to discuss these results. You may reply directly to this message or call the office to provide information for the provider or set up an appointment. In some instances, you will be called with test results and recommendations. Please let us know if this is preferred and we will make note of this in your chart to provide this for you.    If you have not heard a response to your lab or test results in 5 business days from all results returning to [facility], please call the office to let us know. We ask that you please avoid calling prior to this  time unless there is an emergent concern. Due to high call volumes, this can delay the resulting process.  After Hours: For all non-emergency after hours needs, please call the office at [phone] and select the option to reach the on-call  service. On-call services are shared between multiple [facility] offices and therefore it will not be possible to speak directly with your provider. On-call providers may  provide medical advice and recommendations, but are unable to provide refills for maintenance medications.  For all emergency or urgent medical needs after normal business hours, we recommend that you seek care at the closest Urgent Care or Emergency Department to ensure appropriate treatment in a timely manner.  [facility] has a 24 hour emergency room located on the ground floor for your convenience.   Urgent Concerns During the Business Day Providers are seeing patients from 8AM to [place] with a busy schedule and are most often not able to respond to non-urgent calls until the end of the day or the next business day. If you should have URGENT concerns during the day, please call and speak to the nurse or schedule a same day appointment so that we can address your concern without delay.   Thank you, again, for choosing me as your health care partner. I appreciate your trust and look forward to learning more about you!   Purcell Nails Olevia Bowens, DNP, FNP-C  ___________________________________________________________  Health Maintenance Recommendations Screening Testing Mammogram Every 1-2 years based on history and risk factors Starting at age 66 Pap Smear Ages 21-39 every 3 years Ages 69-65 every 5 years with HPV testing More frequent testing may be required based on results and history Colon Cancer Screening Every 1-10 years based on test performed, risk factors, and history Starting at age 79 Bone Density Screening Every 2-10 years based on history Starting at age 41 for women Recommendations for men differ based on medication usage, history, and risk factors AAA Screening One time ultrasound Men 70-75 years old who have ever smoked Lung Cancer Screening Low Dose Lung CT every 12 months Age 31-80 years with a 20 pack-year smoking history who still smoke or who have quit within the last 15 years  Screening Labs Routine  Labs: Complete Blood Count (CBC), Complete Metabolic Panel  (CMP), Cholesterol (Lipid Panel) Every 6-12 months based on history and medications May be recommended more frequently based on current conditions or previous results Hemoglobin A1c Lab Every 3-12 months based on history and previous results Starting at age 88 or earlier with diagnosis of diabetes, high cholesterol, BMI >26, and/or risk factors Frequent monitoring for patients with diabetes to ensure blood sugar control Thyroid Panel  Every 6 months based on history, symptoms, and risk factors May be repeated more often if on medication HIV One time testing for all patients 62 and older May be repeated more frequently for patients with increased risk factors or exposure Hepatitis C One time testing for all patients 109 and older May be repeated more frequently for patients with increased risk factors or exposure Gonorrhea, Chlamydia Every 12 months for all sexually active persons 13-24 years Additional monitoring may be recommended for those who are considered high risk or who have symptoms PSA Men 66-23 years old with risk factors Additional screening may be recommended from age 61-69 based on risk factors, symptoms, and history  Vaccine Recommendations Tetanus Booster All adults every 10 years Flu Vaccine All patients 6 months and older every year COVID Vaccine All patients 12 years  and older Initial dosing with booster May recommend additional booster based on age and health history HPV Vaccine 2 doses all patients age 66-26 Dosing may be considered for patients over 26 Shingles Vaccine (Shingrix) 2 doses all adults 50 years and older Pneumonia (Pneumovax 23) All adults 65 years and older May recommend earlier dosing based on health history Pneumonia (Prevnar 38) All adults 65 years and older Dosed 1 year after Pneumovax 23 Pneumonia (Prevnar 20) All adults 65 years and older (adults 19-64 with certain conditions or risk factors) 1 dose  For those who have not received  Prevnar 13 vaccine previously   Additional Screening, Testing, and Vaccinations may be recommended on an individualized basis based on family history, health history, risk factors, and/or exposure.  __________________________________________________________  Diet Recommendations for All Patients  I recommend that all patients maintain a diet low in saturated fats, carbohydrates, and cholesterol. While this can be challenging at first, it is not impossible and small changes can make big differences.  Things to try: Decreasing the amount of soda, sweet tea, and/or juice to one or less per day and replace with water While water is always the first choice, if you do not like water you may consider adding a water additive without sugar to improve the taste other sugar free drinks Replace potatoes with a brightly colored vegetable  Use healthy oils, such as canola oil or olive oil, instead of butter or hard margarine Limit your bread intake to two pieces or less a day Replace regular pasta with low carb pasta options Bake, broil, or grill foods instead of frying Monitor portion sizes  Eat smaller, more frequent meals throughout the day instead of large meals  An important thing to remember is, if you love foods that are not great for your health, you don't have to give them up completely. Instead, allow these foods to be a reward when you have done well. Allowing yourself to still have special treats every once in a while is a nice way to tell yourself thank you for working hard to keep yourself healthy.   Also remember that every day is a new day. If you have a bad day and "fall off the wagon", you can still climb right back up and keep moving along on your journey!  We have resources available to help you!  Some websites that may be helpful include: www.http://www.wall-moore.info/  Www.VeryWellFit.com _____________________________________________________________  Activity Recommendations for All  Patients  I recommend that all adults get at least 20 minutes of moderate physical activity that elevates your heart rate at least 5 days out of the week.  Some examples include: Walking or jogging at a pace that allows you to carry on a conversation Cycling (stationary bike or outdoors) Water aerobics Yoga Weight lifting Dancing If physical limitations prevent you from putting stress on your joints, exercise in a pool or seated in a chair are excellent options.  Do determine your MAXIMUM heart rate for activity: 220 - YOUR AGE = MAX Heart Rate   Remember! Do not push yourself too hard.  Start slowly and build up your pace, speed, weight, time in exercise, etc.  Allow your body to rest between exercise and get good sleep. You will need more water than normal when you are exerting yourself. Do not wait until you are thirsty to drink. Drink with a purpose of getting in at least 8, 8 ounce glasses of water a day plus more depending on how much you exercise  and sweat.    If you begin to develop dizziness, chest pain, abdominal pain, jaw pain, shortness of breath, headache, vision changes, lightheadedness, or other concerning symptoms, stop the activity and allow your body to rest. If your symptoms are severe, seek emergency evaluation immediately. If your symptoms are concerning, but not severe, please let us know so that we can recommend further evaluation.

## 2022-04-22 NOTE — Progress Notes (Signed)
New Patient Office Visit  Subjective    Patient ID: Cathy Woodard, female    DOB: [birth year]  Age: 32 y.o. MRN: [id number]  CC:  Chief Complaint  Patient presents with   Establish Care    HPI Cathy Woodard presents to establish care. She had been following with [facility].   Patient reports she has been relatively healthy overall. She had gestational diabetes in 2018 and was told cholesterol was mildly elevated at one time, but she was able to normalize levels with diet control. She also reports history of low iron. Her main concern today is frequent skin breakouts.   Patient states that for the past year or so, she has been having frequent skin breakouts of hives-type reaction that is extremely itchy. States that she can touch anything (herself, changing her clothes, bumping into someone, putting glasses on/off, and her skin will quickly start developing a raised, hives-type rash to the area touched that is mildly erythematous and extremely itchy. States the area will stay that was for 30 minutes or so before easing off, but then another area of skin will likely flare up with the same symptoms. She denies any changes to soaps, detergents, environmental elements. She has not noticed any correlation with direct food intake. States that about 4 months before all of this started, she had cut out all red meat from her diet and since then has started pescatarian diet, but has not noticed any specific correlation with her symptoms. States that she has only had maybe 5 days in the past 9 months that did not get any flare up at all - states around that time she was taking Vitamin D and C supplements, but when rash returned she stopped supplementing because she didn't think it was making any difference. She does not have any other allergy symptoms - no rhinorrhea, sneezing, coughing, wheezing, itchy/watery eyes, itchy throat/ears. She has never had any  oropharyngeal symptoms or dyspnea with the rashes.     Outpatient Encounter Medications as of 04/22/2022  Medication Sig   Multiple Vitamin (MULTIVITAMIN ADULT PO) Take by mouth.   [DISCONTINUED] docusate sodium (COLACE) 100 MG capsule Take 1 capsule (100 mg total) by mouth 2 (two) times daily.   [DISCONTINUED] ferrous sulfate 325 (65 FE) MG EC tablet Take 1 tablet (325 mg total) by mouth 2 (two) times daily.   [DISCONTINUED] oxyCODONE (OXY IR/ROXICODONE) 5 MG immediate release tablet Take 1 tablet (5 mg total) by mouth every 4 (four) hours as needed for severe pain.   No facility-administered encounter medications on file as of 04/22/2022.    Past Medical History:  Diagnosis Date   Allergy    Gestational diabetes    Headache    Hypercholesteremia    No pertinent past medical history     Past Surgical History:  Procedure Laterality Date   CESAREAN SECTION     CESAREAN SECTION N/A 02/14/2015   Procedure: CESAREAN SECTION;  Surgeon: Everlene Farrier, MD;  Location: [facility];  Service: Obstetrics;  Laterality: N/A;  edc 02/21/15    CESAREAN SECTION WITH BILATERAL TUBAL LIGATION N/A 08/28/2016   Procedure: CESAREAN SECTION WITH BILATERAL TUBAL LIGATION;  Surgeon: Everlene Farrier, MD;  Location: [facility];  Service: Obstetrics;  Laterality: N/A;  Repeat edc 09/04/16 allergic to benadryl and naproxen need RNFA    Family History  Problem Relation Age of Onset   Diabetes Maternal Grandmother    Bone cancer Maternal  Grandmother 78   Cancer Maternal Uncle        not sure   Cancer Maternal Grandfather        not sure   Anesthesia problems Neg Hx    Hypotension Neg Hx    Malignant hyperthermia Neg Hx    Pseudochol deficiency Neg Hx     Social History   Socioeconomic History   Marital status: Single    Spouse name: Not on file   Number of children: Not on file   Years of education: Not on file   Highest education level: Not on file  Occupational History   Not on file   Tobacco Use   Smoking status: Never   Smokeless tobacco: Never  Substance and Sexual Activity   Alcohol use: Yes    Alcohol/week: 3.0 standard drinks of alcohol    Types: 3 Standard drinks or equivalent per week    Comment: Occas   Drug use: No   Sexual activity: Not Currently    Birth control/protection: None    Comment: Started BCP this AM  Other Topics Concern   Not on file  Social History Narrative   Not on file   Social Determinants of Health   Financial Resource Strain: Not on file  Food Insecurity: Not on file  Transportation Needs: Not on file  Physical Activity: Not on file  Stress: Not on file  Social Connections: Not on file  Intimate Partner Violence: Not on file    ROS All review of systems negative except what is listed in the HPI      Objective    BP 108/75   Pulse 73   Temp 98.1 F (36.7 C)   Resp 16   Ht 5\' 4"  (1.626 m)   Wt 191 lb (86.6 kg)   SpO2 98%   BMI 32.79 kg/m   Physical Exam Vitals reviewed.  Constitutional:      Appearance: Normal appearance.  HENT:     Head: Normocephalic and atraumatic.  Cardiovascular:     Rate and Rhythm: Normal rate and regular rhythm.     Pulses: Normal pulses.     Heart sounds: Normal heart sounds.  Pulmonary:     Effort: Pulmonary effort is normal.     Breath sounds: Normal breath sounds.  Musculoskeletal:     Cervical back: Normal range of motion and neck supple.     Right lower leg: No edema.     Left lower leg: No edema.  Skin:    General: Skin is warm and dry.     Comments: One small, raised, erythematous lesion starting to develop on cheek during visit  Neurological:     Mental Status: She is alert and oriented to person, place, and time.  Psychiatric:        Mood and Affect: Mood normal.        Behavior: Behavior normal.        Thought Content: Thought content normal.        Judgment: Judgment normal.         Assessment & Plan:   Problem List Items Addressed This Visit        Other   Class 1 obesity without serious comorbidity with body mass index (BMI) of 32.0 to 32.9 in adult    Labs today Encourage healthy diet choices like Mediterranean diet, portion control, regular exercise      Relevant Orders   CBC with Differential/Platelet   Comprehensive metabolic panel  TSH   Lipid panel   Iron deficiency    No current supplementation other than daily multivitamin Labs today       Relevant Orders   Iron, TIBC and Ferritin Panel   Vitamin D deficiency    No current supplementation Updating labs today       Relevant Orders   VITAMIN D 25 Hydroxy (Vit-D Deficiency, Fractures)   Other Visit Diagnoses     Hives    -  Primary Uncertain etiology Referral to Allergist for additional testing Recommend trying a daily antihistamine (Zyrtec) to see if any improvement in frequency or severity of symtpoms Patient aware of signs/symptoms requiring further/urgent evaluation.      Relevant Orders   Ambulatory referral to Allergy   History of high cholesterol       Relevant Orders   Comprehensive metabolic panel   Lipid panel   History of gestational diabetes       Relevant Orders   CBC with Differential/Platelet   Comprehensive metabolic panel       Return in about 1 year (around 04/23/2023) for physical; sooner if needed pending labs and allergist workup.   Terrilyn Saver, NP

## 2022-04-22 NOTE — Assessment & Plan Note (Signed)
No current supplementation Updating labs today

## 2022-04-23 LAB — CBC WITH DIFFERENTIAL/PLATELET
Basophils Absolute: 0 10*3/uL (ref 0.0–0.1)
Basophils Relative: 0.4 % (ref 0.0–3.0)
Eosinophils Absolute: 0.1 10*3/uL (ref 0.0–0.7)
Eosinophils Relative: 2.7 % (ref 0.0–5.0)
HCT: 36.9 % (ref 36.0–46.0)
Hemoglobin: 12.5 g/dL (ref 12.0–15.0)
Lymphocytes Relative: 23.9 % (ref 12.0–46.0)
Lymphs Abs: 1.3 10*3/uL (ref 0.7–4.0)
MCHC: 34 g/dL (ref 30.0–36.0)
MCV: 86.8 fl (ref 78.0–100.0)
Monocytes Absolute: 0.4 10*3/uL (ref 0.1–1.0)
Monocytes Relative: 7.5 % (ref 3.0–12.0)
Neutro Abs: 3.5 10*3/uL (ref 1.4–7.7)
Neutrophils Relative %: 65.5 % (ref 43.0–77.0)
Platelets: 275 10*3/uL (ref 150.0–400.0)
RBC: 4.25 Mil/uL (ref 3.87–5.11)
RDW: 14.1 % (ref 11.5–15.5)
WBC: 5.4 10*3/uL (ref 4.0–10.5)

## 2022-04-23 LAB — IRON,TIBC AND FERRITIN PANEL
%SAT: 19 % (calc) (ref 16–45)
Ferritin: 17 ng/mL (ref 16–154)
Iron: 67 ug/dL (ref 40–190)
TIBC: 347 mcg/dL (calc) (ref 250–450)

## 2022-04-23 LAB — COMPREHENSIVE METABOLIC PANEL
ALT: 11 U/L (ref 0–35)
AST: 13 U/L (ref 0–37)
Albumin: 4.1 g/dL (ref 3.5–5.2)
Alkaline Phosphatase: 54 U/L (ref 39–117)
BUN: 11 mg/dL (ref 6–23)
CO2: 30 mEq/L (ref 19–32)
Calcium: 9 mg/dL (ref 8.4–10.5)
Chloride: 104 mEq/L (ref 96–112)
Creatinine, Ser: 0.73 mg/dL (ref 0.40–1.20)
GFR: 108.3 mL/min (ref >60.00)
Glucose, Bld: 86 mg/dL (ref 70–99)
Potassium: 4.4 mEq/L (ref 3.5–5.1)
Sodium: 141 mEq/L (ref 135–145)
Total Bilirubin: 0.5 mg/dL (ref 0.2–1.2)
Total Protein: 6.7 g/dL (ref 6.0–8.3)

## 2022-04-23 LAB — LIPID PANEL
Cholesterol: 192 mg/dL (ref 0–200)
HDL: 56.7 mg/dL (ref >39.00)
LDL Cholesterol: 115 mg/dL — ABNORMAL HIGH (ref 0–99)
NonHDL: 134.87
Total CHOL/HDL Ratio: 3
Triglycerides: 97 mg/dL (ref 0.0–149.0)
VLDL: 19.4 mg/dL (ref 0.0–40.0)

## 2022-04-23 LAB — VITAMIN D 25 HYDROXY (VIT D DEFICIENCY, FRACTURES): VITD: 28.46 ng/mL — ABNORMAL LOW (ref 30.00–100.00)

## 2022-04-23 LAB — TSH: TSH: 0.95 u[IU]/mL (ref 0.35–5.50)

## 2022-05-12 ENCOUNTER — Ambulatory Visit: Payer: 59 | Admitting: Internal Medicine

## 2022-05-12 ENCOUNTER — Encounter: Payer: Self-pay | Admitting: Internal Medicine

## 2022-05-12 VITALS — BP 122/72 | HR 72 | Temp 98.1°F | Resp 16 | Ht 64.0 in | Wt 194.7 lb

## 2022-05-12 DIAGNOSIS — L2084 Intrinsic (allergic) eczema: Secondary | ICD-10-CM

## 2022-05-12 DIAGNOSIS — L501 Idiopathic urticaria: Secondary | ICD-10-CM | POA: Diagnosis not present

## 2022-05-12 NOTE — Patient Instructions (Addendum)
Chronic Idiopathic Urticaria: - this is defined as hives lasting more than 6 weeks without an identifiable trigger - hives can be from a number of different sources including infections, allergies, vibration, temperature, pressure among many others other possible causes - often an identifiable cause is not determined - some potential triggers include: stress, illness, NSAIDs, aspirin, hormonal changes - you do not have any red flag symptoms to make [place] concerned about secondary causes of hives, but we will screen for these for reassurance with: CBC w diff, CMP, tryptase, TSH, hive panel, alpha-gal panel, inflammatory markers - approximately 50% of patients with chronic hives can have some associated swelling of the face/lips/eyelids (this is not a cause for alarm and does not typically progress onto systemic allergic reactions)  Therapy Plan:  - start Allegra (fexofenadine) 180mg  daily  - if hives are uncontrolled, can increase slowly up to a max dose of 2 tabs twice daily  - this is maximum dose -Buy on [place] - much cheaper than drug stores and generic version is fine  - can increase or decrease dosing depending on symptom control to a maximum dose of 4 tablets of antihistamine daily. Wait until hives free for at least one month prior to decreasing dose.   - if hives are still uncontrolled with the above regimen, please arrange an appointment for discussion of Xolair (omalizumab)- an injectable medication for hives  Can use one of the following in place of zyrtec if desires: Claritin (loratadine) 10 mg, Xyzal (levocetirizine) 5 mg or Allegra (fexofenadine) 180 mg daily as needed    Atopic Dermatitis  - Well controlled, may have outgrown  - continue daily moisturizers   Follow up: 4 weeks   Thank you so much for letting me partake in your care today.  Don't hesitate to reach out if you have any additional concerns!  Roney Marion, MD  [facility] [place], [place]

## 2022-05-12 NOTE — Progress Notes (Signed)
New Patient Note  RE: Cathy Woodard MRN: [id number] DOB: 1991/03/31 Date of Office Visit: 05/12/2022  Consult requested by: Terrilyn Saver, NP Primary care provider: Terrilyn Saver, NP  Chief Complaint: Urticaria  History of Present Illness: I had the pleasure of seeing Cathy Woodard for initial evaluation at the [facility] on 05/12/2022. She is a 32 y.o. female, who is referred here by Terrilyn Saver, NP for the evaluation of urticaria .  History obtained from patient, chart review.  For one year she has had recurrent urticaria. Started April 2023   Some pressure related.  No associated angioedema.  She does have some associated  She removed meat from her diet without any change in symptoms.  Hot showers sometimes relieves pruritus.  She has not tried any antihistamines.  She had COVID 2021 and 2022. Did have increase stress at her job working from home prior to onset of symptoms.    Denies any associated food, medication or contact exposure.  Denies any association with sun, cold, heat, exercise, pressure, water or vibration exposure.  Lesions are not painful or fixed.  Denies any associated fevers or arthritis.   Assessment and Plan: Cathy Woodard is a 32 y.o. female with: Idiopathic urticaria - Plan: CBC With Diff/Platelet, CMP14+EGFR, C-reactive protein, TSH, Thyroid antibodies, Tryptase, Chronic Urticaria, Alpha-Gal Panel  Intrinsic atopic dermatitis   Plan: Patient Instructions  Chronic Idiopathic Urticaria: - this is defined as hives lasting more than 6 weeks without an identifiable trigger - hives can be from a number of different sources including infections, allergies, vibration, temperature, pressure among many others other possible causes - often an identifiable cause is not determined - some potential triggers include: stress, illness, NSAIDs, aspirin, hormonal changes - you do not have any red flag symptoms to make [place] concerned about  secondary causes of hives, but we will screen for these for reassurance with: CBC w diff, CMP, tryptase, TSH, hive panel, alpha-gal panel, inflammatory markers - approximately 50% of patients with chronic hives can have some associated swelling of the face/lips/eyelids (this is not a cause for alarm and does not typically progress onto systemic allergic reactions)  Therapy Plan:  - start Allegra (fexofenadine) 180mg  daily  - if hives are uncontrolled, can increase slowly up to a max dose of 2 tabs twice daily  - this is maximum dose -Buy on [place] - much cheaper than drug stores and generic version is fine  - can increase or decrease dosing depending on symptom control to a maximum dose of 4 tablets of antihistamine daily. Wait until hives free for at least one month prior to decreasing dose.   - if hives are still uncontrolled with the above regimen, please arrange an appointment for discussion of Xolair (omalizumab)- an injectable medication for hives  Can use one of the following in place of zyrtec if desires: Claritin (loratadine) 10 mg, Xyzal (levocetirizine) 5 mg or Allegra (fexofenadine) 180 mg daily as needed    Atopic Dermatitis  - Well controlled, may have outgrown  - continue daily moisturizers   Follow up: 4 weeks   Thank you so much for letting me partake in your care today.  Don't hesitate to reach out if you have any additional concerns!  Roney Marion, MD  [facility] [place] [place]      No orders of the defined types were placed in this encounter.  Lab Orders  CBC With Diff/Platelet         CMP14+EGFR         C-reactive protein         TSH         Thyroid antibodies         Tryptase         Chronic Urticaria         Alpha-Gal Panel      Other allergy screening: Asthma: no Rhino conjunctivitis: no Food allergy: no Medication allergy:  Naproxen - causes urticaria, tolerates ibuprofen Hymenoptera allergy: no Urticaria:  yes Eczema:yes started as a child, flares usually on neck, well controlled over the past decade.  She does use lotion daily and avoids fragrance/dye containing products  History of recurrent infections suggestive of immunodeficency: no  Diagnostics:   Skin Testing:  deferred due to [facility] insurance  .    Past Medical History: Patient Active Problem List   Diagnosis Date Noted   Class 1 obesity without serious comorbidity with body mass index (BMI) of 32.0 to 32.9 in adult 04/22/2022   Iron deficiency 04/22/2022   Vitamin D deficiency 04/22/2022   Cesarean delivery delivered 08/28/2016   Previous cesarean section 02/14/2015   Past Medical History:  Diagnosis Date   Allergy    Gestational diabetes    Headache    Hypercholesteremia    No pertinent past medical history    Past Surgical History: Past Surgical History:  Procedure Laterality Date   CESAREAN SECTION     CESAREAN SECTION N/A 02/14/2015   Procedure: CESAREAN SECTION;  Surgeon: Everlene Farrier, MD;  Location: [facility];  Service: Obstetrics;  Laterality: N/A;  edc 02/21/15    CESAREAN SECTION WITH BILATERAL TUBAL LIGATION N/A 08/28/2016   Procedure: CESAREAN SECTION WITH BILATERAL TUBAL LIGATION;  Surgeon: Everlene Farrier, MD;  Location: [facility];  Service: Obstetrics;  Laterality: N/A;  Repeat edc 09/04/16 allergic to benadryl and naproxen need RNFA   Medication List:  Current Outpatient Medications  Medication Sig Dispense Refill   Cholecalciferol (VITAMIN D3) 75 MCG (3000 UT) TABS Take 1 capsule by mouth once a week.     No current facility-administered medications for this visit.   Allergies: Allergies  Allergen Reactions   Anaprox [Naproxen Sodium] Rash   Social History: Social History   Socioeconomic History   Marital status: Single    Spouse name: Not on file   Number of children: Not on file   Years of education: Not on file   Highest education level: Not on file  Occupational History   Not  on file  Tobacco Use   Smoking status: Never   Smokeless tobacco: Never  Substance and Sexual Activity   Alcohol use: Yes    Alcohol/week: 3.0 standard drinks of alcohol    Types: 3 Standard drinks or equivalent per week    Comment: Occas   Drug use: No   Sexual activity: Not Currently    Birth control/protection: None    Comment: Started BCP this AM  Other Topics Concern   Not on file  Social History Narrative   Not on file   Social Determinants of Health   Financial Resource Strain: Not on file  Food Insecurity: Not on file  Transportation Needs: Not on file  Physical Activity: Not on file  Stress: Not on file  Social Connections: Not on file   Lives in a single-family home alone and 32 years old.  There are no roaches in the house  and bed is 2 feet on the floor.  There are no dust mite precautions.  Not exposed to fumes, chemicals or dust.  There is no HEPA filter in the home and [place] not near an [place] area. Smoking: No exposure Occupation: Works as a Pension scheme manager History: Environmental education officer in the house: no Charity fundraiser in the family room: no Carpet in the bedroom: yes Heating: electric Cooling: central Pet: no  Family History: Family History  Problem Relation Age of Onset   Diabetes Maternal Grandmother    Bone cancer Maternal Grandmother 78   Cancer Maternal Uncle        not sure   Cancer Maternal Grandfather        not sure   Anesthesia problems Neg Hx    Hypotension Neg Hx    Malignant hyperthermia Neg Hx    Pseudochol deficiency Neg Hx      ROS: All others negative except as noted per HPI.   Objective: BP 122/72   Pulse 72   Temp 98.1 F (36.7 C) (Temporal)   Resp 16   Ht 5\' 4"  (1.626 m)   Wt 194 lb 11.2 oz (88.3 kg)   SpO2 99%   BMI 33.42 kg/m  Body mass index is 33.42 kg/m.  General Appearance:  Alert, cooperative, no distress, appears stated age  Head:  Normocephalic, without obvious abnormality,  atraumatic  Eyes:  Conjunctiva clear, EOM's intact  Nose: Nares normal, normal mucosa, no visible anterior polyps, and septum midline  Throat: Lips, tongue normal; teeth and gums normal, normal posterior oropharynx  Neck: Supple, symmetrical  Lungs:   clear to auscultation bilaterally, Respirations unlabored, no coughing  Heart:  regular rate and rhythm and no murmur, Appears well perfused  Extremities: No edema  Skin: Skin color, texture, turgor normal, no rashes or lesions on visualized portions of skin  Neurologic: No gross deficits   The plan was reviewed with the patient/family, and all questions/concerned were addressed.  It was my pleasure to see Cathy Woodard today and participate in her care. Please feel free to contact me with any questions or concerns.  Sincerely,  Roney Marion, MD Allergy & Immunology  [facility] office: [phone] [facility] office: [phone]

## 2022-05-13 LAB — CMP14+EGFR
AST: 11 IU/L (ref 0–40)
Albumin: 4.1 g/dL (ref 3.9–4.9)
Chloride: 102 mmol/L (ref 96–106)
eGFR: 117 mL/min/{1.73_m2} (ref >59)

## 2022-05-13 LAB — CBC WITH DIFF/PLATELET
EOS (ABSOLUTE): 0.3 10*3/uL (ref 0.0–0.4)
Immature Granulocytes: 0 %
MCH: 28.7 pg (ref 26.6–33.0)
Neutrophils: 57 %
Platelets: 290 10*3/uL (ref 150–450)

## 2022-05-13 LAB — ALPHA-GAL PANEL

## 2022-05-13 LAB — TSH: TSH: 1.64 u[IU]/mL (ref 0.450–4.500)

## 2022-05-13 LAB — THYROID ANTIBODIES: Thyroglobulin Antibody: <1 IU/mL (ref 0.0–0.9)

## 2022-05-14 LAB — CMP14+EGFR
ALT: 13 IU/L (ref 0–32)
Calcium: 9 mg/dL (ref 8.7–10.2)
Potassium: 4.5 mmol/L (ref 3.5–5.2)
Total Protein: 6.8 g/dL (ref 6.0–8.5)

## 2022-05-14 LAB — CHRONIC URTICARIA

## 2022-05-14 LAB — ALPHA-GAL PANEL

## 2022-05-14 LAB — THYROID ANTIBODIES: Thyroperoxidase Ab SerPl-aCnc: 13 IU/mL (ref 0–34)

## 2022-05-14 LAB — CBC WITH DIFF/PLATELET
Basos: 1 %
Immature Grans (Abs): 0 10*3/uL (ref 0.0–0.1)
WBC: 7.2 10*3/uL (ref 3.4–10.8)

## 2022-05-16 LAB — CBC WITH DIFF/PLATELET
Basophils Absolute: 0.1 10*3/uL (ref 0.0–0.2)
Hemoglobin: 12.5 g/dL (ref 11.1–15.9)
Lymphocytes Absolute: 2.1 10*3/uL (ref 0.7–3.1)
Lymphs: 29 %
MCHC: 32.7 g/dL (ref 31.5–35.7)
Monocytes Absolute: 0.6 10*3/uL (ref 0.1–0.9)
RBC: 4.36 x10E6/uL (ref 3.77–5.28)

## 2022-05-16 LAB — C-REACTIVE PROTEIN: CRP: 3 mg/L (ref 0–10)

## 2022-05-16 LAB — CMP14+EGFR
Bilirubin Total: 0.2 mg/dL (ref 0.0–1.2)
Glucose: 87 mg/dL (ref 70–99)

## 2022-05-16 LAB — TRYPTASE: Tryptase: 8.3 ug/L (ref 2.2–13.2)

## 2022-05-26 LAB — CMP14+EGFR
Albumin/Globulin Ratio: 1.5 (ref 1.2–2.2)
Alkaline Phosphatase: 67 IU/L (ref 44–121)
BUN/Creatinine Ratio: 19 (ref 9–23)
BUN: 13 mg/dL (ref 6–20)
CO2: 25 mmol/L (ref 20–29)
Creatinine, Ser: 0.69 mg/dL (ref 0.57–1.00)
Globulin, Total: 2.7 g/dL (ref 1.5–4.5)
Sodium: 139 mmol/L (ref 134–144)

## 2022-05-26 LAB — ALPHA-GAL PANEL
Beef IgE: <0.1 kU/L
IgE (Immunoglobulin E), Serum: 220 IU/mL (ref 6–495)
O215-IgE Alpha-Gal: <0.1 kU/L
Pork IgE: 0.2 kU/L — AB

## 2022-05-26 LAB — CBC WITH DIFF/PLATELET
Eos: 4 %
Hematocrit: 38.2 % (ref 34.0–46.6)
MCV: 88 fL (ref 79–97)
Monocytes: 9 %
Neutrophils Absolute: 4.1 10*3/uL (ref 1.4–7.0)
RDW: 13 % (ref 11.7–15.4)

## 2022-05-26 NOTE — Progress Notes (Signed)
I reviewed the bloodwork. Blood count, kidney function, liver function, electrolytes, thyroid, autoimmune screener, inflammation markers, chronic urticaria index (checks for autoantibodies that trigger mast cells), tryptase (checks for mast cell issues) and alpha gal (checks for red meat allergy) were all normal which is great.   Can someone let patient know?

## 2022-06-09 ENCOUNTER — Encounter: Payer: Self-pay | Admitting: Internal Medicine

## 2022-06-09 ENCOUNTER — Ambulatory Visit: Payer: 59 | Admitting: Internal Medicine

## 2022-06-09 VITALS — BP 130/82 | HR 64 | Temp 98.2°F | Resp 16

## 2022-06-09 DIAGNOSIS — K5909 Other constipation: Secondary | ICD-10-CM | POA: Diagnosis not present

## 2022-06-09 DIAGNOSIS — L2084 Intrinsic (allergic) eczema: Secondary | ICD-10-CM | POA: Diagnosis not present

## 2022-06-09 DIAGNOSIS — L501 Idiopathic urticaria: Secondary | ICD-10-CM | POA: Diagnosis not present

## 2022-06-09 NOTE — Patient Instructions (Addendum)
Chronic Idiopathic Urticaria: Well-controlled -Urticaria labs were done on 05/12/2022 and were all negative   Therapy Plan:  - Decrease Allegra (fexofenadine) '180mg'$  1  tabs in morning - If hive free for 2 weeks, stop allegra completely  - Can titrate between 1-4 tabs of allegra daily as needed to control hives     Atopic Dermatitis: well-controlled - Well controlled,  - continue daily moisturizers   Follow up: 6 month as needed   Thank you so much for letting me partake in your care today.  Don't hesitate to reach out if you have any additional concerns!  Roney Marion, MD  [facility] [place], [place]

## 2022-06-09 NOTE — Progress Notes (Signed)
Follow Up Note  RE: Cathy Woodard MRN: [id number] DOB: [birth year] Date of Office Visit: 06/09/2022  Referring provider: Terrilyn Saver, NP Primary care provider: Terrilyn Saver, NP  Chief Complaint: Urticaria (/)  History of Present Illness: I had the pleasure of seeing Cathy Woodard for a follow up visit at the [facility] on 06/09/2022. She is a 32 y.o. female, who is being followed for idiopathic urticaria . Her previous allergy office visit was on 05/12/22 with Dr. Edison Pace. Today is a regular follow up visit.  History obtained from patient, chart review .   Urticaria   She is well controlled with allegra '180mg'$  2 tabs daily.  Initially started at 1 tab daily which lessened hives but did not fully resolve them.  Since starting 2 tabs daily for the past 3 weeks she has had no urticaria. No breakthrough pruritus. Feels like she has had increased constipation since starting antihistamines.  Atopic Dermatitis  Well-controlled, no flares.  Uses lotions daily.  Not required any topical steroids.   Assessment and Plan: Cathy Woodard is a 32 y.o. female with: Idiopathic urticaria  Intrinsic atopic dermatitis  Other constipation  Do not suspect Allegra is causing constipation however her hives are well-controlled this time to stepdown antihistamine therapy. Plan as below Plan: Patient Instructions  Chronic Idiopathic Urticaria: Well-controlled -Urticaria labs were done on 05/12/2022 and were all negative   Therapy Plan:  - Decrease Allegra (fexofenadine) '180mg'$  1  tabs in morning - If hive free for 2 weeks, stop allegra completely  - Can titrate between 1-4 tabs of allegra daily as needed to control hives     Atopic Dermatitis: well-controlled - Well controlled,  - continue daily moisturizers   Follow up: 6 month as needed   Thank you so much for letting me partake in your care today.  Don't hesitate to reach out if you have any additional  concerns!  Roney Marion, MD  [facility] [place] [place]      No orders of the defined types were placed in this encounter.   Lab Orders  No laboratory test(s) ordered today   Diagnostics: None done   Medication List:  Current Outpatient Medications  Medication Sig Dispense Refill   Cholecalciferol (VITAMIN D3) 75 MCG (3000 UT) TABS Take 1 capsule by mouth once a week.     fexofenadine (ALLEGRA) 180 MG tablet Take 180 mg by mouth daily.     No current facility-administered medications for this visit.   Allergies: Allergies  Allergen Reactions   Anaprox [Naproxen Sodium] Rash   I reviewed her past medical history, social history, family history, and environmental history and no significant changes have been reported from her previous visit.  ROS: All others negative except as noted per HPI.   Objective: BP 130/82   Pulse 64   Temp 98.2 F (36.8 C) (Temporal)   Resp 16   SpO2 98%  There is no height or weight on file to calculate BMI. General Appearance:  Alert, cooperative, no distress, appears stated age  Head:  Normocephalic, without obvious abnormality, atraumatic  Eyes:  Conjunctiva clear, EOM's intact  Nose: Nares normal,   Throat: Lips, tongue normal; teeth and gums normal,   Neck: Supple, symmetrical  Lungs:   , Respirations unlabored, no coughing  Heart:  , Appears well perfused  Extremities: No edema  Skin: Skin color, texture, turgor normal, no rashes or lesions on visualized portions of skin  Neurologic: No gross deficits   Previous notes and tests were reviewed. The plan was reviewed with the patient/family, and all questions/concerned were addressed.  It was my pleasure to see Cathy Woodard today and participate in her care. Please feel free to contact me with any questions or concerns.  Sincerely,  Roney Marion, MD  Allergy & Immunology  [facility] Office: [phone]

## 2024-01-05 DIAGNOSIS — L989 Disorder of the skin and subcutaneous tissue, unspecified: Secondary | ICD-10-CM | POA: Diagnosis not present

## 2024-02-08 DIAGNOSIS — M542 Cervicalgia: Secondary | ICD-10-CM | POA: Diagnosis not present

## 2024-02-08 DIAGNOSIS — Z859 Personal history of malignant neoplasm, unspecified: Secondary | ICD-10-CM | POA: Diagnosis not present

## 2024-03-28 DIAGNOSIS — Z113 Encounter for screening for infections with a predominantly sexual mode of transmission: Secondary | ICD-10-CM | POA: Diagnosis not present
